# Patient Record
Sex: Female | Born: 1950 | Race: Black or African American | Hispanic: No | Marital: Married
Health system: Southern US, Community
[De-identification: ages and names within clinical notes are randomized; demographics above are authoritative.]

## PROBLEM LIST (undated history)

## (undated) DIAGNOSIS — K648 Other hemorrhoids: Secondary | ICD-10-CM

## (undated) DIAGNOSIS — E119 Type 2 diabetes mellitus without complications: Secondary | ICD-10-CM

## (undated) DIAGNOSIS — K5790 Diverticulosis of intestine, part unspecified, without perforation or abscess without bleeding: Secondary | ICD-10-CM

## (undated) DIAGNOSIS — N6019 Diffuse cystic mastopathy of unspecified breast: Secondary | ICD-10-CM

## (undated) DIAGNOSIS — N3289 Other specified disorders of bladder: Secondary | ICD-10-CM

## (undated) HISTORY — PX: HERNIA REPAIR: SHX51

---

## 2004-10-23 ENCOUNTER — Ambulatory Visit: Payer: Self-pay | Admitting: Obstetrics and Gynecology

## 2004-11-23 ENCOUNTER — Ambulatory Visit: Payer: Self-pay | Admitting: Unknown Physician Specialty

## 2004-11-23 HISTORY — PX: COLONOSCOPY: SHX174

## 2005-10-11 HISTORY — PX: BREAST BIOPSY: SHX20

## 2005-11-09 ENCOUNTER — Ambulatory Visit: Payer: Self-pay | Admitting: Obstetrics and Gynecology

## 2005-11-16 ENCOUNTER — Ambulatory Visit: Payer: Self-pay | Admitting: Obstetrics and Gynecology

## 2006-05-19 ENCOUNTER — Ambulatory Visit: Payer: Self-pay | Admitting: Surgery

## 2006-06-21 ENCOUNTER — Other Ambulatory Visit: Payer: Self-pay

## 2006-07-01 ENCOUNTER — Ambulatory Visit: Payer: Self-pay | Admitting: Surgery

## 2006-12-12 ENCOUNTER — Ambulatory Visit: Payer: Self-pay | Admitting: Obstetrics and Gynecology

## 2007-12-19 ENCOUNTER — Ambulatory Visit: Payer: Self-pay | Admitting: Obstetrics and Gynecology

## 2008-12-23 ENCOUNTER — Ambulatory Visit: Payer: Self-pay | Admitting: Obstetrics and Gynecology

## 2009-12-24 ENCOUNTER — Ambulatory Visit: Payer: Self-pay | Admitting: Obstetrics and Gynecology

## 2010-12-14 ENCOUNTER — Ambulatory Visit: Payer: Self-pay | Admitting: Unknown Physician Specialty

## 2010-12-28 ENCOUNTER — Ambulatory Visit: Payer: Self-pay | Admitting: Obstetrics and Gynecology

## 2011-12-29 ENCOUNTER — Ambulatory Visit: Payer: Self-pay | Admitting: Obstetrics and Gynecology

## 2012-12-29 ENCOUNTER — Ambulatory Visit: Payer: Self-pay | Admitting: Obstetrics and Gynecology

## 2013-12-31 ENCOUNTER — Ambulatory Visit: Payer: Self-pay | Admitting: Obstetrics and Gynecology

## 2014-01-02 ENCOUNTER — Ambulatory Visit: Payer: Self-pay | Admitting: Obstetrics and Gynecology

## 2014-07-02 ENCOUNTER — Encounter: Payer: Self-pay | Admitting: Urology

## 2014-07-05 ENCOUNTER — Ambulatory Visit: Payer: Self-pay | Admitting: Obstetrics and Gynecology

## 2014-07-11 ENCOUNTER — Encounter: Payer: Self-pay | Admitting: Urology

## 2014-08-11 ENCOUNTER — Encounter: Payer: Self-pay | Admitting: Urology

## 2014-09-10 ENCOUNTER — Encounter: Payer: Self-pay | Admitting: Urology

## 2015-01-06 ENCOUNTER — Ambulatory Visit: Payer: Self-pay | Admitting: Obstetrics and Gynecology

## 2015-08-01 ENCOUNTER — Encounter: Payer: Self-pay | Admitting: Physician Assistant

## 2015-08-01 ENCOUNTER — Ambulatory Visit: Payer: Self-pay | Admitting: Physician Assistant

## 2015-08-01 VITALS — BP 144/82 | Temp 98.1°F

## 2015-08-01 DIAGNOSIS — R059 Cough, unspecified: Secondary | ICD-10-CM

## 2015-08-01 DIAGNOSIS — R05 Cough: Secondary | ICD-10-CM

## 2015-08-01 NOTE — Progress Notes (Signed)
S:  Still coughing, sugars have been higher than normal so didn't want to use the prednisone, also had flu shot last Thursday, but cough has not gotten worse since injection, states is better when she elevates her head, using inhaler with a little relief, no cp/sob/; mucus is clear, no fever/chills  O: vitals wnl, nad, tms clear, nasal mucosa pale pink and swollen, throat wnl, neck supple no lymph, lungs c t a,c v rrr  A: cough  P: trial of flonase, if cough not improved by Wed 10/26 will order cxr; also discussed silent reflux being part of problem, pt wants to only use flonase and albuterol for right now, if cxr normal and cough is continuing will do trial of zantac or omeprazole

## 2015-08-11 ENCOUNTER — Ambulatory Visit
Admission: RE | Admit: 2015-08-11 | Discharge: 2015-08-11 | Disposition: A | Discharge status: Home / Self Care | Payer: PRIVATE HEALTH INSURANCE | Source: Ambulatory Visit | Attending: Physician Assistant | Admitting: Physician Assistant

## 2015-08-11 DIAGNOSIS — R05 Cough: Secondary | ICD-10-CM | POA: Diagnosis not present

## 2015-08-11 NOTE — Progress Notes (Signed)
I spoke with the patient about her xray results and she expressed understanding.  Per Susan's authorization I called in Omeprazole 20mg  with 3 refills into [facility].

## 2015-08-11 NOTE — Addendum Note (Signed)
Addended by: Versie Starks on: 08/11/2015 09:45 AM   Modules accepted: Orders

## 2015-08-11 NOTE — Progress Notes (Signed)
Pt called and stated she is still coughing, cxr ordered

## 2015-10-29 ENCOUNTER — Other Ambulatory Visit: Payer: Self-pay | Admitting: Obstetrics and Gynecology

## 2015-10-29 DIAGNOSIS — Z1231 Encounter for screening mammogram for malignant neoplasm of breast: Secondary | ICD-10-CM

## 2015-11-17 ENCOUNTER — Other Ambulatory Visit: Payer: Self-pay

## 2015-11-17 DIAGNOSIS — E119 Type 2 diabetes mellitus without complications: Secondary | ICD-10-CM

## 2015-11-17 NOTE — Progress Notes (Signed)
Patient came in to have blood drawn per Dr. Blane Ohara order.  Blood was drawn from the right arm without any incident. Patient wants results sent to Dr. Ola Spurr and a copy mailed to her attention to her home address.

## 2015-11-18 LAB — CMP12+LP+TP+TSH+6AC+CBC/D/PLT
ALBUMIN: 4 g/dL (ref 3.6–4.8)
ALT: 28 IU/L (ref 0–32)
AST: 20 IU/L (ref 0–40)
Albumin/Globulin Ratio: 1.4 (ref 1.1–2.5)
Alkaline Phosphatase: 96 IU/L (ref 39–117)
BASOS ABS: 0 10*3/uL (ref 0.0–0.2)
BILIRUBIN TOTAL: 0.4 mg/dL (ref 0.0–1.2)
BUN/Creatinine Ratio: 14 (ref 11–26)
BUN: 13 mg/dL (ref 8–27)
Basos: 0 %
CHLORIDE: 102 mmol/L (ref 96–106)
CHOLESTEROL TOTAL: 129 mg/dL (ref 100–199)
CREATININE: 0.9 mg/dL (ref 0.57–1.00)
Calcium: 9 mg/dL (ref 8.7–10.3)
Chol/HDL Ratio: 3.1 ratio units (ref 0.0–4.4)
EOS (ABSOLUTE): 0.2 10*3/uL (ref 0.0–0.4)
Eos: 3 %
FREE THYROXINE INDEX: 2.4 (ref 1.2–4.9)
GFR, EST AFRICAN AMERICAN: 78 mL/min/{1.73_m2} (ref >59)
GFR, EST NON AFRICAN AMERICAN: 68 mL/min/{1.73_m2} (ref >59)
GGT: 22 IU/L (ref 0–60)
Globulin, Total: 2.9 g/dL (ref 1.5–4.5)
Glucose: 132 mg/dL — ABNORMAL HIGH (ref 65–99)
HDL: 41 mg/dL (ref >39)
HEMOGLOBIN: 13.7 g/dL (ref 11.1–15.9)
Hematocrit: 39.7 % (ref 34.0–46.6)
IMMATURE GRANULOCYTES: 0 %
IRON: 107 ug/dL (ref 27–139)
Immature Grans (Abs): 0 10*3/uL (ref 0.0–0.1)
LDH: 144 IU/L (ref 119–226)
LDL Calculated: 71 mg/dL (ref 0–99)
LYMPHS ABS: 2.5 10*3/uL (ref 0.7–3.1)
Lymphs: 38 %
MCH: 31.3 pg (ref 26.6–33.0)
MCHC: 34.5 g/dL (ref 31.5–35.7)
MCV: 91 fL (ref 79–97)
MONOCYTES: 8 %
Monocytes Absolute: 0.5 10*3/uL (ref 0.1–0.9)
NEUTROS PCT: 51 %
Neutrophils Absolute: 3.3 10*3/uL (ref 1.4–7.0)
PLATELETS: 285 10*3/uL (ref 150–379)
Phosphorus: 3.7 mg/dL (ref 2.5–4.5)
Potassium: 4.4 mmol/L (ref 3.5–5.2)
RBC: 4.38 x10E6/uL (ref 3.77–5.28)
RDW: 14.5 % (ref 12.3–15.4)
Sodium: 143 mmol/L (ref 134–144)
T3 UPTAKE RATIO: 24 % (ref 24–39)
T4 TOTAL: 9.9 ug/dL (ref 4.5–12.0)
TOTAL PROTEIN: 6.9 g/dL (ref 6.0–8.5)
TSH: 3.44 u[IU]/mL (ref 0.450–4.500)
Triglycerides: 83 mg/dL (ref 0–149)
Uric Acid: 5.8 mg/dL (ref 2.5–7.1)
VLDL CHOLESTEROL CAL: 17 mg/dL (ref 5–40)
WBC: 6.6 10*3/uL (ref 3.4–10.8)

## 2015-11-18 LAB — HGB A1C W/O EAG: HEMOGLOBIN A1C: 7.5 % — AB (ref 4.8–5.6)

## 2015-11-19 NOTE — Progress Notes (Signed)
A copy of the lab results were mailed to patient and a copy was sent to patient's primary care provider.

## 2016-01-07 ENCOUNTER — Ambulatory Visit
Admission: RE | Admit: 2016-01-07 | Discharge: 2016-01-07 | Disposition: A | Discharge status: Home / Self Care | Payer: Managed Care, Other (non HMO) | Source: Ambulatory Visit | Attending: Obstetrics and Gynecology | Admitting: Obstetrics and Gynecology

## 2016-01-07 DIAGNOSIS — Z1231 Encounter for screening mammogram for malignant neoplasm of breast: Secondary | ICD-10-CM | POA: Diagnosis present

## 2016-01-28 ENCOUNTER — Encounter: Payer: Self-pay | Admitting: Physician Assistant

## 2016-01-28 ENCOUNTER — Ambulatory Visit: Payer: Self-pay | Admitting: Physician Assistant

## 2016-01-28 VITALS — BP 125/80 | HR 91 | Temp 99.4°F

## 2016-01-28 DIAGNOSIS — J069 Acute upper respiratory infection, unspecified: Secondary | ICD-10-CM

## 2016-01-28 DIAGNOSIS — R509 Fever, unspecified: Secondary | ICD-10-CM

## 2016-01-28 LAB — POCT INFLUENZA A/B
INFLUENZA A, POC: NEGATIVE
INFLUENZA B, POC: NEGATIVE

## 2016-01-28 MED ORDER — ALBUTEROL SULFATE HFA 108 (90 BASE) MCG/ACT IN AERS: 2.0000 | INHALATION_SPRAY | RESPIRATORY_TRACT | Status: DC | PRN

## 2016-01-28 MED ORDER — LEVOFLOXACIN 500 MG PO TABS: 500.0000 mg | ORAL_TABLET | Freq: Every day | ORAL | Status: DC

## 2016-01-28 NOTE — Progress Notes (Signed)
S: c/o continued cough and congestion with rattling in her chest at night, having to use inhaler on and off, mucus is mostly clear, denies blood in mucus; had a negative cxr on 08/10/2016; states cough has never completely gone away since then, saw her pcp and he reviewed the cxr results; yesterday started with fever chills body aches; still has cough, is exposed to second hand cigarrette smoke, her husband recently stopped smoking in the house due to her having trouble with cough, is a nonsmoker; parents did not smoke in the house when she was young  O: vitals wnl, nad, tms clear, nasal mucosa swollen, throat wnl, neck supple no lymph, lungs mostly c but occasional wheeze noted in upper lung fields, cv rrr, flu swab neg  A: acute upper respiratory, ?reactive airway disease  P: levaquin due to fever with cough and neg flu, refill on albuterol, return to clinic or pcp if getting worse or not improving

## 2016-01-31 ENCOUNTER — Ambulatory Visit
Admission: EM | Admit: 2016-01-31 | Discharge: 2016-01-31 | Disposition: A | Discharge status: Home / Self Care | Payer: Managed Care, Other (non HMO) | Attending: Family Medicine | Admitting: Family Medicine

## 2016-01-31 ENCOUNTER — Encounter: Payer: Self-pay | Admitting: *Deleted

## 2016-01-31 ENCOUNTER — Ambulatory Visit (INDEPENDENT_AMBULATORY_CARE_PROVIDER_SITE_OTHER): Payer: Managed Care, Other (non HMO)

## 2016-01-31 DIAGNOSIS — J302 Other seasonal allergic rhinitis: Secondary | ICD-10-CM

## 2016-01-31 DIAGNOSIS — J189 Pneumonia, unspecified organism: Secondary | ICD-10-CM

## 2016-01-31 DIAGNOSIS — H6593 Unspecified nonsuppurative otitis media, bilateral: Secondary | ICD-10-CM

## 2016-01-31 HISTORY — DX: Type 2 diabetes mellitus without complications: E11.9

## 2016-01-31 MED ORDER — IPRATROPIUM-ALBUTEROL 0.5-2.5 (3) MG/3ML IN SOLN
3.0000 mL | Freq: Once | RESPIRATORY_TRACT | Status: AC
  Administered 2016-01-31: 3 mL via RESPIRATORY_TRACT

## 2016-01-31 MED ORDER — SALINE SPRAY 0.65 % NA SOLN: 2.0000 | NASAL | Status: DC

## 2016-01-31 MED ORDER — CEFDINIR 300 MG PO CAPS: 300.0000 mg | ORAL_CAPSULE | Freq: Two times a day (BID) | ORAL | Status: AC

## 2016-01-31 NOTE — Discharge Instructions (Signed)
Allergic Rhinitis Allergic rhinitis is when the mucous membranes in the nose respond to allergens. Allergens are particles in the air that cause your body to have an allergic reaction. This causes you to release allergic antibodies. Through a chain of events, these eventually cause you to release histamine into the blood stream. Although meant to protect the body, it is this release of histamine that causes your discomfort, such as frequent sneezing, congestion, and an itchy, runny nose.  CAUSES Seasonal allergic rhinitis (hay fever) is caused by pollen allergens that may come from grasses, trees, and weeds. Year-round allergic rhinitis (perennial allergic rhinitis) is caused by allergens such as house dust mites, pet dander, and mold spores. SYMPTOMS  Nasal stuffiness (congestion).  Itchy, runny nose with sneezing and tearing of the eyes. DIAGNOSIS Your health care provider can help you determine the allergen or allergens that trigger your symptoms. If you and your health care provider are unable to determine the allergen, skin or blood testing may be used. Your health care provider will diagnose your condition after taking your health history and performing a physical exam. Your health care provider may assess you for other related conditions, such as asthma, pink eye, or an ear infection. TREATMENT Allergic rhinitis does not have a cure, but it can be controlled by:  Medicines that block allergy symptoms. These may include allergy shots, nasal sprays, and oral antihistamines.  Avoiding the allergen. Hay fever may often be treated with antihistamines in pill or nasal spray forms. Antihistamines block the effects of histamine. There are over-the-counter medicines that may help with nasal congestion and swelling around the eyes. Check with your health care provider before taking or giving this medicine. If avoiding the allergen or the medicine prescribed do not work, there are many new medicines  your health care provider can prescribe. Stronger medicine may be used if initial measures are ineffective. Desensitizing injections can be used if medicine and avoidance does not work. Desensitization is when a patient is given ongoing shots until the body becomes less sensitive to the allergen. Make sure you follow up with your health care provider if problems continue. HOME CARE INSTRUCTIONS It is not possible to completely avoid allergens, but you can reduce your symptoms by taking steps to limit your exposure to them. It helps to know exactly what you are allergic to so that you can avoid your specific triggers. SEEK MEDICAL CARE IF:  You have a fever.  You develop a cough that does not stop easily (persistent).  You have shortness of breath.  You start wheezing.  Symptoms interfere with normal daily activities.   This information is not intended to replace advice given to you by your health care provider. Make sure you discuss any questions you have with your health care provider.   Document Released: 06/22/2001 Document Revised: 10/18/2014 Document Reviewed: 06/04/2013 Elsevier Interactive Patient Education 2016 [place]. Otitis Media With Effusion Otitis media with effusion is the presence of fluid in the middle ear. This is a common problem in children, which often follows ear infections. It may be present for weeks or longer after the infection. Unlike an acute ear infection, otitis media with effusion refers only to fluid behind the ear drum and not infection. Children with repeated ear and sinus infections and allergy problems are the most likely to get otitis media with effusion. CAUSES  The most frequent cause of the fluid buildup is dysfunction of the eustachian tubes. These are the tubes that drain fluid  in the ears to the back of the nose (nasopharynx). SYMPTOMS   The main symptom of this condition is hearing loss. As a result, you or your child may:  Listen to the TV  at a loud volume.  Not respond to questions.  Ask "what" often when spoken to.  Mistake or confuse one sound or word for another.  There may be a sensation of fullness or pressure but usually not pain. DIAGNOSIS   Your health care provider will diagnose this condition by examining you or your child's ears.  Your health care provider may test the pressure in you or your child's ear with a tympanometer.  A hearing test may be conducted if the problem persists. TREATMENT   Treatment depends on the duration and the effects of the effusion.  Antibiotics, decongestants, nose drops, and cortisone-type drugs (tablets or nasal spray) may not be helpful.  Children with persistent ear effusions may have delayed language or behavioral problems. Children at risk for developmental delays in hearing, learning, and speech may require referral to a specialist earlier than children not at risk.  You or your child's health care provider may suggest a referral to an ear, nose, and throat surgeon for treatment. The following may help restore normal hearing:  Drainage of fluid.  Placement of ear tubes (tympanostomy tubes).  Removal of adenoids (adenoidectomy). HOME CARE INSTRUCTIONS   Avoid secondhand smoke.  Infants who are breastfed are less likely to have this condition.  Avoid feeding infants while they are lying flat.  Avoid known environmental allergens.  Avoid people who are sick. SEEK MEDICAL CARE IF:   Hearing is not better in 3 months.  Hearing is worse.  Ear pain.  Drainage from the ear.  Dizziness. MAKE SURE YOU:   Understand these instructions.  Will watch your condition.  Will get help right away if you are not doing well or get worse.   This information is not intended to replace advice given to you by your health care provider. Make sure you discuss any questions you have with your health care provider.   Document Released: 11/04/2004 Document Revised:  10/18/2014 Document Reviewed: 04/24/2013 Elsevier Interactive Patient Education 2016 [place] Pneumonia, Adult Pneumonia is an infection of the lungs. There are different types of pneumonia. One type can develop while a person is in a hospital. A different type, called community-acquired pneumonia, develops in people who are not, or have not recently been, in the hospital or other health care facility.  CAUSES Pneumonia may be caused by bacteria, viruses, or funguses. Community-acquired pneumonia is often caused by Streptococcus pneumonia bacteria. These bacteria are often passed from one person to another by breathing in droplets from the cough or sneeze of an infected person. RISK FACTORS The condition is more likely to develop in:  People who havechronic diseases, such as chronic obstructive pulmonary disease (COPD), asthma, congestive heart failure, cystic fibrosis, diabetes, or kidney disease.  People who haveearly-stage or late-stage HIV.  People who havesickle cell disease.  People who havehad their spleen removed (splenectomy).  People who havepoor Human resources officer.  People who havemedical conditions that increase the risk of breathing in (aspirating) secretions their own mouth and nose.   People who havea weakened immune system (immunocompromised).  People who smoke.  People whotravel to areas where pneumonia-causing germs commonly exist.  People whoare around animal habitats or animals that have pneumonia-causing germs, including birds, bats, rabbits, cats, and farm animals. SYMPTOMS Symptoms of this condition include:  Adry cough.  A wet (productive) cough.  Fever.  Sweating.  Chest pain, especially when breathing deeply or coughing.  Rapid breathing or difficulty breathing.  Shortness of breath.  Shaking chills.  Fatigue.  Muscle aches. DIAGNOSIS Your health care provider will take a medical history and perform a  physical exam. You may also have other tests, including:  Imaging studies of your chest, including X-rays.  Tests to check your blood oxygen level and other blood gases.  Other tests on blood, mucus (sputum), fluid around your lungs (pleural fluid), and urine. If your pneumonia is severe, other tests may be done to identify the specific cause of your illness. TREATMENT The type of treatment that you receive depends on many factors, such as the cause of your pneumonia, the medicines you take, and other medical conditions that you have. For most adults, treatment and recovery from pneumonia may occur at home. In some cases, treatment must happen in a hospital. Treatment may include:  Antibiotic medicines, if the pneumonia was caused by bacteria.  Antiviral medicines, if the pneumonia was caused by a virus.  Medicines that are given by mouth or through an IV tube.  Oxygen.  Respiratory therapy. Although rare, treating severe pneumonia may include:  Mechanical ventilation. This is done if you are not breathing well on your own and you cannot maintain a safe blood oxygen level.  Thoracentesis. This procedureremoves fluid around one lung or both lungs to help you breathe better. HOME CARE INSTRUCTIONS  Take over-the-counter and prescription medicines only as told by your health care provider.  Only takecough medicine if you are losing sleep. Understand that cough medicine can prevent your body's natural ability to remove mucus from your lungs.  If you were prescribed an antibiotic medicine, take it as told by your health care provider. Do not stop taking the antibiotic even if you start to feel better.  Sleep in a semi-upright position at night. Try sleeping in a reclining chair, or place a few pillows under your head.  Do not use tobacco products, including cigarettes, chewing tobacco, and e-cigarettes. If you need help quitting, ask your health care provider.  Drink enough water to  keep your urine clear or pale yellow. This will help to thin out mucus secretions in your lungs. PREVENTION There are ways that you can decrease your risk of developing community-acquired pneumonia. Consider getting a pneumococcal vaccine if:  You are older than 65 years of age.  You are older than 65 years of age and are undergoing cancer treatment, have chronic lung disease, or have other medical conditions that affect your immune system. Ask your health care provider if this applies to you. There are different types and schedules of pneumococcal vaccines. Ask your health care provider which vaccination option is best for you. You may also prevent community-acquired pneumonia if you take these actions:  Get an influenza vaccine every year. Ask your health care provider which type of influenza vaccine is best for you.  Go to the dentist on a regular basis.  Wash your hands often. Use hand sanitizer if soap and water are not available. SEEK MEDICAL CARE IF:  You have a fever.  You are losing sleep because you cannot control your cough with cough medicine. SEEK IMMEDIATE MEDICAL CARE IF:  You have worsening shortness of breath.  You have increased chest pain.  Your sickness becomes worse, especially if you are an older adult or have a weakened immune system.  You cough up  blood.   This information is not intended to replace advice given to you by your health care provider. Make sure you discuss any questions you have with your health care provider.   Document Released: 09/27/2005 Document Revised: 06/18/2015 Document Reviewed: 01/22/2015 Elsevier Interactive Patient Education [employer].

## 2016-01-31 NOTE — ED Notes (Signed)
Onset of URI type symptoms last Monday, seen at her PCP on Weds and tested neg for flu, pt not sure of dx at that time but she was rx Levofloxacin. Here today for persisting and worsening symptoms. Pt is NIDDM and is unable to eat, states no appetite. Pt feels that her antibiotic is causing some of her symptoms.

## 2016-01-31 NOTE — ED Provider Notes (Signed)
CSN: [id number]     Arrival date & time 01/31/16  1306 History   First MD Initiated Contact with Patient 01/31/16 1403     Chief Complaint  Patient presents with  . Cough  . Nausea  . Chills  . Fever   (Consider location/radiation/quality/duration/timing/severity/associated sxs/prior Treatment) HPI Comments: Single african [place] female here for evaluation wheezing, stomach upset on levaquin given to her by PA Manuela Schwartz Fischer 19 Apr.  Finger stick elevated last night 201 and this am 168 despite decreased po intake, nausea.  Has had reoccurent wheezing with seasonal allergies the past couple of years and was given albuterol inhaler to use prn.  Last used yesterday before bed 1 puff helps some.  Hasn't taken claritin since starting levaquin Wednesday.  Flu test negative on 19th has had exposure to others sick.  Clear nasal discharge and cough productive rarely yellow    Patient is a 66 y.o. female presenting with cough and fever. The history is provided by the patient.  Cough Cough characteristics:  Productive Sputum characteristics:  Clear and yellow Severity:  Moderate Onset quality:  Gradual Duration:  6 days Timing:  Intermittent Progression:  Waxing and waning Chronicity:  New Smoker: no   Context: exposure to allergens, sick contacts, upper respiratory infection, weather changes and with activity   Context: not animal exposure, not fumes, not occupational exposure and not smoke exposure   Relieved by:  Nothing Worsened by:  Deep breathing, exposure to cold air, lying down, environmental changes and activity Ineffective treatments:  Rest and ipratropium inhaler Associated symptoms: chills, ear fullness, fever, headaches, myalgias, rhinorrhea, shortness of breath, sinus congestion, sore throat and wheezing   Associated symptoms: no chest pain, no diaphoresis, no ear pain, no eye discharge, no rash and no weight loss   Fever:    Duration:  6 days   Timing:  Intermittent   Max temp  PTA (F):  100 tmax   Temp source:  Oral   Progression:  Waxing and waning Headaches:    Severity:  Mild   Onset quality:  Sudden   Duration:  6 days   Timing:  Intermittent   Progression:  Waxing and waning Myalgias:    Location:  Generalized   Quality:  Aching   Severity:  Moderate   Onset quality:  Sudden   Duration:  6 days   Progression:  Waxing and waning Rhinorrhea:    Quality:  White   Severity:  Moderate   Duration:  6 days   Timing:  Constant   Progression:  Waxing and waning Shortness of breath:    Severity:  Mild   Onset quality:  Sudden   Duration:  6 days   Timing:  Intermittent   Progression:  Waxing and waning Sore throat:    Severity:  Mild   Onset quality:  Sudden   Duration:  6 days   Timing:  Constant   Progression:  Waxing and waning Wheezing:    Severity:  Moderate   Onset quality:  Sudden   Duration:  6 days   Timing:  Intermittent   Progression:  Waxing and waning   Chronicity:  Recurrent Risk factors: recent infection   Risk factors: no chemical exposure and no recent travel   Fever Associated symptoms: chills, congestion, cough, headaches, myalgias, nausea, rhinorrhea and sore throat   Associated symptoms: no chest pain, no confusion, no diarrhea, no dysuria, no ear pain, no rash and no vomiting     Past Medical History  Diagnosis Date  . Diabetes mellitus without complication [facility])    Past Surgical History  Procedure Laterality Date  . Breast biopsy Right 2007    EXCISIONAL - NEG   Family History  Problem Relation Age of Onset  . Breast cancer Neg Hx    Social History  Substance Use Topics  . Smoking status: Passive Smoke Exposure - Never Smoker  . Smokeless tobacco: None  . Alcohol Use: No   OB History    No data available     Review of Systems  Constitutional: Positive for fever, chills, activity change, appetite change and fatigue. Negative for weight loss and diaphoresis.  HENT: Positive for congestion, postnasal  drip, rhinorrhea, sinus pressure, sneezing and sore throat. Negative for dental problem, drooling, ear discharge, ear pain, facial swelling, hearing loss, mouth sores, nosebleeds, tinnitus, trouble swallowing and voice change.   Eyes: Negative for photophobia, pain, discharge, redness, itching and visual disturbance.  Respiratory: Positive for cough, shortness of breath and wheezing. Negative for choking, chest tightness and stridor.   Cardiovascular: Negative for chest pain.  Gastrointestinal: Positive for nausea. Negative for vomiting, abdominal pain, diarrhea, constipation, blood in stool and abdominal distention.  Endocrine: Negative for cold intolerance and heat intolerance.  Genitourinary: Negative for dysuria.  Musculoskeletal: Positive for myalgias. Negative for back pain, joint swelling, arthralgias, gait problem, neck pain and neck stiffness.  Skin: Negative for color change, pallor, rash and wound.  Allergic/Immunologic: Positive for environmental allergies. Negative for food allergies and immunocompromised state.  Neurological: Positive for headaches. Negative for dizziness, tremors, seizures, syncope, facial asymmetry, speech difficulty, weakness, light-headedness and numbness.  Hematological: Negative for adenopathy. Does not bruise/bleed easily.  Psychiatric/Behavioral: Negative for behavioral problems, confusion, sleep disturbance and agitation.    Allergies  Codeine and Sulfa antibiotics  Home Medications   Prior to Admission medications   Medication Sig Start Date End Date Taking? Authorizing Provider  albuterol (PROVENTIL HFA) 108 (90 Base) MCG/ACT inhaler Inhale 2 puffs into the lungs every 4 (four) hours as needed for wheezing or shortness of breath. 01/28/16  Yes Versie Starks, PA-C  aspirin 81 MG tablet Take 81 mg by mouth daily.   Yes Historical Provider, MD  Blood Glucose Monitoring Suppl (FIFTY50 GLUCOSE METER 2.0) W/DEVICE KIT Use 1 each as directed. 11/06/14  Yes  Historical Provider, MD  calcium carbonate (OSCAL) 1500 (600 CA) MG TABS tablet Take by mouth 2 (two) times daily with a meal.   Yes Historical Provider, MD  levofloxacin (LEVAQUIN) 500 MG tablet Take 1 tablet (500 mg total) by mouth daily. 01/28/16  Yes Versie Starks, PA-C  loratadine (CLARITIN) 10 MG tablet Take 10 mg by mouth daily.   Yes Historical Provider, MD  metFORMIN (GLUCOPHAGE) 500 MG tablet Take 1,000 mg by mouth 2 (two) times daily with a meal.    Yes Historical Provider, MD  Multiple Vitamin (MULTIVITAMIN) tablet Take 1 tablet by mouth daily.   Yes Historical Provider, MD  cefdinir (OMNICEF) 300 MG capsule Take 1 capsule (300 mg total) by mouth 2 (two) times daily. 01/31/16 02/10/16  Olen Cordial, NP  sodium chloride (OCEAN) 0.65 % SOLN nasal spray Place 2 sprays into both nostrils every 2 (two) hours while awake. 01/31/16   Olen Cordial, NP   Meds Ordered and Administered this Visit   Medications  ipratropium-albuterol (DUONEB) 0.5-2.5 (3) MG/3ML nebulizer solution 3 mL (3 mLs Nebulization Given 01/31/16 1416)    BP 143/67 mmHg  Temp(Src) 100 F (37.8  C) (Oral)  Resp 16  Ht _0  (1.626 m)  Wt 210 lb (95.255 kg)  BMI 36.03 kg/m2  SpO2 95% No data found.   Physical Exam  Constitutional: She is oriented to person, place, and time. She appears well-developed and well-nourished. She is active and cooperative.  Non-toxic appearance. She does not have a sickly appearance. She appears ill. No distress.  HENT:  Head: Normocephalic and atraumatic.  Right Ear: Hearing, external ear and ear canal normal. A middle ear effusion is present.  Left Ear: Hearing, external ear and ear canal normal. A middle ear effusion is present.  Nose: Mucosal edema and rhinorrhea present. No nose lacerations, sinus tenderness, nasal deformity, septal deviation or nasal septal hematoma. No epistaxis.  No foreign bodies. Right sinus exhibits no maxillary sinus tenderness and no frontal sinus  tenderness. Left sinus exhibits no maxillary sinus tenderness and no frontal sinus tenderness.  Mouth/Throat: Uvula is midline and mucous membranes are normal. Mucous membranes are not pale, not dry and not cyanotic. She does not have dentures. No oral lesions. No trismus in the jaw. Normal dentition. No dental abscesses, uvula swelling, lacerations or dental caries. Posterior oropharyngeal edema and posterior oropharyngeal erythema present. No oropharyngeal exudate or tonsillar abscesses.  Cobblestoning posterior pharynx; bilateral TMs with air fluid level; nasal turbinates with edema/erythema clear discharge  Eyes: Conjunctivae, EOM and lids are normal. Pupils are equal, round, and reactive to light. Right eye exhibits no chemosis, no discharge, no exudate and no hordeolum. No foreign body present in the right eye. Left eye exhibits no chemosis, no discharge, no exudate and no hordeolum. No foreign body present in the left eye. Right conjunctiva is not injected. Right conjunctiva has no hemorrhage. Left conjunctiva is not injected. Left conjunctiva has no hemorrhage. No scleral icterus. Right eye exhibits normal extraocular motion and no nystagmus. Left eye exhibits normal extraocular motion and no nystagmus. Right pupil is round and reactive. Left pupil is round and reactive. Pupils are equal.  Neck: Trachea normal and normal range of motion. Neck supple. No tracheal tenderness, no spinous process tenderness and no muscular tenderness present. No rigidity. No tracheal deviation, no edema, no erythema and normal range of motion present. No thyroid mass and no thyromegaly present.  Cardiovascular: Normal rate, regular rhythm, S1 normal, S2 normal, normal heart sounds and intact distal pulses.  PMI is not displaced.  Exam reveals no gallop and no friction rub.   No murmur heard. Pulmonary/Chest: Effort normal. No accessory muscle usage or stridor. No respiratory distress. She has decreased breath sounds in  the right lower field and the left lower field. She has wheezes in the right upper field, the right middle field, the right lower field, the left upper field, the left middle field and the left lower field. She has no rhonchi. She has no rales. She exhibits no tenderness.  Audible wheezing without auscultation; inspiratory and expiratory; decreased basilar breath sounds  Abdominal: Soft. She exhibits no shifting dullness, no distension, no pulsatile liver, no fluid wave, no abdominal bruit, no ascites, no pulsatile midline mass and no mass. Bowel sounds are decreased. There is no hepatosplenomegaly. There is generalized tenderness. There is no rigidity, no rebound, no guarding, no CVA tenderness, no tenderness at McBurney's point and negative Murphy's sign. Hernia confirmed negative in the ventral area.  Dull to percussion x 4 quads; hypoactive bowel sounds x 4 quads  Musculoskeletal: Normal range of motion. She exhibits no edema or tenderness.  Right shoulder: Normal.       Left shoulder: Normal.       Right hip: Normal.       Left hip: Normal.       Right knee: Normal.       Left knee: Normal.       Cervical back: Normal.       Right hand: Normal.       Left hand: Normal.  Lymphadenopathy:       Head (right side): No submental, no submandibular, no tonsillar, no preauricular, no posterior auricular and no occipital adenopathy present.       Head (left side): No submental, no submandibular, no tonsillar, no preauricular, no posterior auricular and no occipital adenopathy present.    She has no cervical adenopathy.       Right cervical: No superficial cervical, no deep cervical and no posterior cervical adenopathy present.      Left cervical: No superficial cervical, no deep cervical and no posterior cervical adenopathy present.  Neurological: She is alert and oriented to person, place, and time. She has normal strength. She is not disoriented. She displays no atrophy and no tremor. No  cranial nerve deficit or sensory deficit. She exhibits normal muscle tone. She displays no seizure activity. Coordination and gait normal. GCS eye subscore is 4. GCS verbal subscore is 5. GCS motor subscore is 6.  Skin: Skin is warm, dry and intact. No abrasion, no bruising, no burn, no ecchymosis, no laceration, no lesion, no petechiae and no rash noted. She is not diaphoretic. No cyanosis or erythema. No pallor. Nails show no clubbing.  Psychiatric: She has a normal mood and affect. Her speech is normal and behavior is normal. Judgment and thought content normal. Cognition and memory are normal.  Nursing note and vitals reviewed.   ED Course  Procedures (including critical care time)  Labs Review Labs Reviewed - No data to display  Imaging Review Dg Chest 2 View  01/31/2016  CLINICAL DATA:  Productive cough for 5 days now along with wheezing. Non-smoker. No history of heart issues. No history of asthma. Patient reports no other symptoms at this time. 1st time being seen for. EXAM: CHEST  2 VIEW COMPARISON:  08/11/2015 FINDINGS: Heart size is normal. Faint opacity is identified within the left lower lobe, indicating probable early infectious process. There are no pleural effusions or pulmonary edema. IMPRESSION: Suspect early left lower lobe infiltrate. Followup PA and lateral chest X-ray is recommended in 3-4 weeks following trial of antibiotic therapy to ensure resolution and exclude underlying malignancy. Electronically Signed   By: Nolon Nations M.D.   On: 01/31/2016 14:32    1416 duoneb 57m administered by CMA JRedmond Baseman 1535 sp02 95% RA improved air flow expiratory only wheeze now on auscultations s/p duoneb.  Chest xray results infiltrate LLL  Patient asked her antibiotics to be changed as unable to tolerate due to nausea.  Will change to cefdinir 3049mpo BID.  Has robitussin and albuterol inhaler at home for prn cough/wheeze use.  [facility] in [facility]-4 weeks.  Patient given copy of radiology report.  Patient verbalized understanding of information/instructions, agreed with plan of care.  MDM   1. Community acquired pneumonia   2. Seasonal allergic rhinitis   3. Otitis media with effusion, bilateral    Pneumonia community acquired simple, community acquired, may have started as viral (probably respiratory syncytial, parainfluenza, influenza, or adenovirus), but  now evidence of acute periobronchial pneumonia LLL on chest xray.  Patient not tolerating levaquin will change to cefdinir 327m po BID x 10 days.  Continue albuterol 2 puffs but ATC x 48 hours q4-6h instead of just prn due to chronic wheezing.  Differential Diagnoses:  Reactive Airway Disease (asthma, allergic aspergillosis eosinophilia), chronic bronchitis, respiratory infection (sinusitis, common cold, pneumonia), congestive heart failure, smoke/irritant exposure, reflux esophagitis, bronchogenic tumor, and/or aspiration syndromes.  I will give   I discussed that approximately 50% of patients with acute bronchitis have a cough that lasts up to three weeks, and 25% for over a month. Tylenol, one to two tablets every four hours as needed for fever or myalgias. Patient instructed to follow up in one week with PCM or sooner if symptoms worsen. Patient verbalized agreement and understanding of treatment plan.  P2:  hand washing and cover cough  Has not been consistent with claritin 137mpo daily.  Reinforced daily use.  Nasal saline 2 sprays each nostril q2h prn congestion.  Flonase 1 spray each nostril BID. Avoid triggers if possible.  Shower prior to bedtime if exposed to triggers.  If allergic dust/dust mites recommend mattress/pillow covers/encasements; washing linens, vacuuming, sweeping, dusting weekly.  Call or return to clinic as needed if these symptoms worsen or fail to improve as anticipated.   Exitcare handout on allergic rhinitis given to patient.  Patient verbalized  understanding of instructions, agreed with plan of care and had no further questions at this time.  P2:  Avoidance and hand washing.  Supportive treatment.   No evidence of invasive bacterial infection, non toxic and well hydrated.  This is most likely self limiting viral infection.  I do not see where any further testing or imaging is necessary at this time.   I will suggest supportive care, rest, good hygiene and encourage the patient to take adequate fluids.  The patient is to return to clinic or EMERGENCY ROOM if symptoms worsen or change significantly e.g. ear pain, fever, purulent discharge from ears or bleeding.  Exitcare handout on otitis media with effusion given to patient.  Patient verbalized agreement and understanding of treatment plan.    TiOlen CordialNP 01/31/16 1607

## 2016-02-05 ENCOUNTER — Telehealth: Payer: Self-pay | Admitting: Family Medicine

## 2016-02-05 NOTE — Telephone Encounter (Signed)
Telephone message left for patient I was calling to follow up with her to see if she was feeling better after we changed her antibiotics and how her blood sugar levels and breathing have been this week.  If not improving patient to follow up with [facility] [address] for re-evaluation.

## 2016-03-22 ENCOUNTER — Encounter (INDEPENDENT_AMBULATORY_CARE_PROVIDER_SITE_OTHER): Payer: Self-pay

## 2016-03-22 ENCOUNTER — Other Ambulatory Visit: Payer: Self-pay

## 2016-03-22 DIAGNOSIS — Z299 Encounter for prophylactic measures, unspecified: Secondary | ICD-10-CM

## 2016-03-22 NOTE — Progress Notes (Signed)
Patient came in to have blood work done per Dr. Manon Hilding at [facility].  Blood was drawn from the right arm without any incident.

## 2016-03-23 LAB — MICROALBUMIN / CREATININE URINE RATIO
Creatinine, Urine: 170.4 mg/dL
MICROALB/CREAT RATIO: 4.5 mg/g{creat} (ref 0.0–30.0)
Microalbumin, Urine: 7.7 ug/mL

## 2016-03-23 LAB — HGB A1C W/O EAG: Hgb A1c MFr Bld: 6.8 % — ABNORMAL HIGH (ref 4.8–5.6)

## 2016-11-12 ENCOUNTER — Other Ambulatory Visit: Payer: Self-pay | Admitting: Obstetrics and Gynecology

## 2016-11-12 DIAGNOSIS — Z1231 Encounter for screening mammogram for malignant neoplasm of breast: Secondary | ICD-10-CM

## 2017-01-10 ENCOUNTER — Ambulatory Visit
Admission: RE | Admit: 2017-01-10 | Discharge: 2017-01-10 | Disposition: A | Discharge status: Home / Self Care | Payer: Medicare HMO | Source: Ambulatory Visit | Attending: Obstetrics and Gynecology | Admitting: Obstetrics and Gynecology

## 2017-01-10 DIAGNOSIS — Z1231 Encounter for screening mammogram for malignant neoplasm of breast: Secondary | ICD-10-CM | POA: Diagnosis present

## 2017-03-11 ENCOUNTER — Encounter: Payer: Self-pay | Admitting: *Deleted

## 2017-03-14 ENCOUNTER — Encounter
Admission: RE | Disposition: A | Discharge status: Home / Self Care | Payer: Self-pay | Source: Ambulatory Visit | Attending: Unknown Physician Specialty

## 2017-03-14 ENCOUNTER — Ambulatory Visit: Payer: Medicare HMO | Admitting: Anesthesiology

## 2017-03-14 ENCOUNTER — Encounter: Payer: Self-pay | Admitting: *Deleted

## 2017-03-14 ENCOUNTER — Ambulatory Visit
Admission: RE | Admit: 2017-03-14 | Discharge: 2017-03-14 | Disposition: A | Discharge status: Home / Self Care | Payer: Medicare HMO | Source: Ambulatory Visit | Attending: Unknown Physician Specialty | Admitting: Unknown Physician Specialty

## 2017-03-14 DIAGNOSIS — Z885 Allergy status to narcotic agent status: Secondary | ICD-10-CM | POA: Diagnosis not present

## 2017-03-14 DIAGNOSIS — K640 First degree hemorrhoids: Secondary | ICD-10-CM | POA: Insufficient documentation

## 2017-03-14 DIAGNOSIS — Z882 Allergy status to sulfonamides status: Secondary | ICD-10-CM | POA: Insufficient documentation

## 2017-03-14 DIAGNOSIS — N3289 Other specified disorders of bladder: Secondary | ICD-10-CM | POA: Diagnosis not present

## 2017-03-14 DIAGNOSIS — Z1211 Encounter for screening for malignant neoplasm of colon: Secondary | ICD-10-CM | POA: Insufficient documentation

## 2017-03-14 DIAGNOSIS — D120 Benign neoplasm of cecum: Secondary | ICD-10-CM | POA: Insufficient documentation

## 2017-03-14 DIAGNOSIS — E119 Type 2 diabetes mellitus without complications: Secondary | ICD-10-CM | POA: Insufficient documentation

## 2017-03-14 DIAGNOSIS — K5730 Diverticulosis of large intestine without perforation or abscess without bleeding: Secondary | ICD-10-CM | POA: Insufficient documentation

## 2017-03-14 DIAGNOSIS — Z86010 Personal history of colonic polyps: Secondary | ICD-10-CM | POA: Diagnosis not present

## 2017-03-14 DIAGNOSIS — Z7951 Long term (current) use of inhaled steroids: Secondary | ICD-10-CM | POA: Insufficient documentation

## 2017-03-14 DIAGNOSIS — Z7722 Contact with and (suspected) exposure to environmental tobacco smoke (acute) (chronic): Secondary | ICD-10-CM | POA: Diagnosis not present

## 2017-03-14 DIAGNOSIS — Z9889 Other specified postprocedural states: Secondary | ICD-10-CM | POA: Diagnosis not present

## 2017-03-14 DIAGNOSIS — Z7984 Long term (current) use of oral hypoglycemic drugs: Secondary | ICD-10-CM | POA: Diagnosis not present

## 2017-03-14 DIAGNOSIS — Z79899 Other long term (current) drug therapy: Secondary | ICD-10-CM | POA: Diagnosis not present

## 2017-03-14 DIAGNOSIS — Z7982 Long term (current) use of aspirin: Secondary | ICD-10-CM | POA: Insufficient documentation

## 2017-03-14 DIAGNOSIS — K635 Polyp of colon: Secondary | ICD-10-CM | POA: Insufficient documentation

## 2017-03-14 DIAGNOSIS — D123 Benign neoplasm of transverse colon: Secondary | ICD-10-CM | POA: Diagnosis not present

## 2017-03-14 HISTORY — DX: Other hemorrhoids: K64.8

## 2017-03-14 HISTORY — DX: Diffuse cystic mastopathy of unspecified breast: N60.19

## 2017-03-14 HISTORY — DX: Other specified disorders of bladder: N32.89

## 2017-03-14 HISTORY — PX: COLONOSCOPY WITH PROPOFOL: SHX5780

## 2017-03-14 HISTORY — DX: Diverticulosis of intestine, part unspecified, without perforation or abscess without bleeding: K57.90

## 2017-03-14 LAB — GLUCOSE, CAPILLARY: Glucose-Capillary: 118 mg/dL — ABNORMAL HIGH (ref 65–99)

## 2017-03-14 SURGERY — COLONOSCOPY WITH PROPOFOL
Anesthesia: General

## 2017-03-14 MED ORDER — PROPOFOL 500 MG/50ML IV EMUL
INTRAVENOUS | Status: AC
  Filled 2017-03-14: qty 50

## 2017-03-14 MED ORDER — SODIUM CHLORIDE 0.9 % IV SOLN: INTRAVENOUS | Status: DC

## 2017-03-14 MED ORDER — SODIUM CHLORIDE 0.9 % IV SOLN
INTRAVENOUS | Status: DC
  Administered 2017-03-14 (×2): via INTRAVENOUS

## 2017-03-14 NOTE — Transfer of Care (Signed)
Immediate Anesthesia Transfer of Care Note  Patient: Teresa Black  Procedure(s) Performed: Procedure(s): COLONOSCOPY WITH PROPOFOL (N/A)  Patient Location: PACU  Anesthesia Type:General  Level of Consciousness: awake, alert  and oriented  Airway & Oxygen Therapy: Patient Spontanous Breathing and Patient connected to face mask oxygen  Post-op Assessment: Report given to RN and Post -op Vital signs reviewed and stable  Post vital signs: Reviewed and stable  Last Vitals:  Vitals:   03/14/17 1024 03/14/17 1151  BP: (!) 163/88 113/70  Pulse: 86 87  Resp: 18 19  Temp: 36.2 C (!) 35.9 C    Last Pain:  Vitals:   03/14/17 1151  TempSrc: Tympanic         Complications: No apparent anesthesia complications

## 2017-03-14 NOTE — Anesthesia Post-op Follow-up Note (Cosign Needed)
Anesthesia QCDR form completed.        

## 2017-03-14 NOTE — H&P (Signed)
Primary Care Physician:  Leonel Ramsay, MD Primary Gastroenterologist:  Dr. Vira Agar  Pre-Procedure History & Physical: HPI:  Teresa Black is a 66 y.o. female is here for an colonoscopy.  FH colon cancer in sister.   Past Medical History:  Diagnosis Date  . Bladder spasms   . Diabetes mellitus without complication ([place])   . Diverticulosis   . Fibrocystic breast disease   . Hemorrhoids, internal     Past Surgical History:  Procedure Laterality Date  . BREAST BIOPSY Right 2007   EXCISIONAL - NEG  . COLONOSCOPY  11/23/2004  . HERNIA REPAIR      Prior to Admission medications   Medication Sig Start Date End Date Taking? Authorizing Provider  aspirin 81 MG tablet Take 81 mg by mouth daily.   Yes [provider]  calcium carbonate (OSCAL) 1500 (600 CA) MG TABS tablet Take by mouth 2 (two) times daily with a meal.   Yes [provider]  loratadine (CLARITIN) 10 MG tablet Take 10 mg by mouth daily as needed.    Yes [provider]  metFORMIN (GLUCOPHAGE) 500 MG tablet Take 1,000 mg by mouth 2 (two) times daily with a meal.    Yes [provider]  Multiple Vitamin (MULTIVITAMIN) tablet Take 1 tablet by mouth daily.   Yes [provider]  sodium chloride (OCEAN) 0.65 % SOLN nasal spray Place 2 sprays into both nostrils every 2 (two) hours while awake. 01/31/16  Yes Betancourt, Aura Fey, NP  tolterodine (DETROL LA) 4 MG 24 hr capsule Take 4 mg by mouth daily.   Yes [provider]  albuterol (PROVENTIL HFA) 108 (90 Base) MCG/ACT inhaler Inhale 2 puffs into the lungs every 4 (four) hours as needed for wheezing or shortness of breath. Patient not taking: Reported on 03/14/2017 01/28/16   Versie Starks, PA-C  Blood Glucose Monitoring Suppl (FIFTY50 GLUCOSE METER 2.0) W/DEVICE KIT Use 1 each as directed. 11/06/14   [provider]  levofloxacin (LEVAQUIN) 500 MG tablet Take 1 tablet (500 mg total) by mouth daily. Patient not  taking: Reported on 03/14/2017 01/28/16   Versie Starks, PA-C    Allergies as of 12/27/2016 - Review Complete 02/05/2016  Allergen Reaction Noted  . Codeine Nausea And Vomiting 08/01/2015  . Sulfa antibiotics  08/01/2015    Family History  Problem Relation Age of Onset  . Breast cancer Neg Hx     Social History   Social History  . Marital status: Married    Spouse name: N/A  . Number of children: N/A  . Years of education: N/A   Occupational History  . Not on file.   Social History Main Topics  . Smoking status: Passive Smoke Exposure - Never Smoker  . Smokeless tobacco: Never Used  . Alcohol use No  . Drug use: No  . Sexual activity: Not on file   Other Topics Concern  . Not on file   Social History Narrative  . No narrative on file    Review of Systems: See HPI, otherwise negative ROS  Physical Exam: BP (!) 163/88   Pulse 86   Temp 97.2 F (36.2 C) (Tympanic)   Resp 18   Ht '5\' 4"'$  (1.626 m)   Wt 98.4 kg (217 lb)   SpO2 100%   BMI 37.25 kg/m  General:   Alert,  pleasant and cooperative in NAD Head:  Normocephalic and atraumatic. Neck:  Supple; no masses or thyromegaly. Lungs:  Clear throughout to auscultation.    Heart:  Regular rate and rhythm. Abdomen:  Soft, nontender and nondistended. Normal bowel sounds, without guarding, and without rebound.   Neurologic:  Alert and  oriented x4;  grossly normal neurologically.  Impression/Plan: Teresa Black is here for an colonoscopy to be performed for colonoscopy  Risks, benefits, limitations, and alternatives regarding  colonoscopy have been reviewed with the patient.  Questions have been answered.  All parties agreeable.   Gaylyn Cheers, MD  03/14/2017, 11:20 AM

## 2017-03-14 NOTE — Op Note (Signed)
[facility] Gastroenterology Patient Name: Teresa Black Procedure Date: 03/14/2017 11:20 AM MRN: [id number] Account #: [id number] Date of Birth: Apr 11, 1951 Admit Type: Outpatient Age: 66 Room: [facility] ENDO ROOM 1 Gender: Female Note Status: Finalized Procedure:            Colonoscopy Indications:          High risk colon cancer surveillance: Personal history                        of colonic polyps Providers:            Manya Silvas, MD Referring MD:         Adrian Prows (Referring MD) Medicines:            Propofol per Anesthesia Complications:        No immediate complications. Procedure:            Pre-Anesthesia Assessment:                       - After reviewing the risks and benefits, the patient                        was deemed in satisfactory condition to undergo the                        procedure.                       After obtaining informed consent, the colonoscope was                        passed under direct vision. Throughout the procedure,                        the patient's blood pressure, pulse, and oxygen                        saturations were monitored continuously. The                        Colonoscope was introduced through the anus and                        advanced to the the cecum, identified by appendiceal                        orifice and ileocecal valve. The colonoscopy was                        performed without difficulty. The patient tolerated the                        procedure well. The quality of the bowel preparation                        was good. Findings:      Three sessile polyps were found in the sigmoid colon, transverse colon       and cecum. The polyps were diminutive in size. These polyps were removed       with a jumbo cold forceps. Resection and retrieval were complete.  Multiple small-mouthed diverticula were found in the sigmoid colon,       descending colon and transverse colon.  Internal hemorrhoids were found during endoscopy. The hemorrhoids were       small and Grade I (internal hemorrhoids that do not prolapse).      The exam was otherwise without abnormality. Impression:           - Three diminutive polyps in the sigmoid colon, in the                        transverse colon and in the cecum, removed with a jumbo                        cold forceps. Resected and retrieved.                       - Diverticulosis in the sigmoid colon, in the                        descending colon and in the transverse colon.                       - Internal hemorrhoids.                       - The examination was otherwise normal. Recommendation:       - Await pathology results. Manya Silvas, MD 03/14/2017 11:49:19 AM This report has been signed electronically. Number of Addenda: 0 Note Initiated On: 03/14/2017 11:20 AM Scope Withdrawal Time: 0 hours 10 minutes 44 seconds  Total Procedure Duration: 0 hours 18 minutes 22 seconds       [facility]

## 2017-03-14 NOTE — Anesthesia Postprocedure Evaluation (Signed)
Anesthesia Post Note  Patient: Teresa Black  Procedure(s) Performed: Procedure(s) (LRB): COLONOSCOPY WITH PROPOFOL (N/A)  Patient location during evaluation: Endoscopy Anesthesia Type: General Level of consciousness: awake and alert Pain management: pain level controlled Vital Signs Assessment: post-procedure vital signs reviewed and stable Respiratory status: spontaneous breathing and respiratory function stable Cardiovascular status: stable Anesthetic complications: no     Last Vitals:  Vitals:   03/14/17 1024 03/14/17 1151  BP: (!) 163/88 113/70  Pulse: 86 87  Resp: 18 19  Temp: 36.2 C (!) 35.9 C    Last Pain:  Vitals:   03/14/17 1151  TempSrc: Tympanic                 Bobbette Eakes K

## 2017-03-14 NOTE — Anesthesia Preprocedure Evaluation (Signed)
Anesthesia Evaluation  Patient identified by MRN, date of birth, ID band Patient awake    Reviewed: Allergy & Precautions, NPO status , Patient's Chart, lab work & pertinent test results  History of Anesthesia Complications Negative for: history of anesthetic complications  Airway Mallampati: III       Dental   Pulmonary neg pulmonary ROS,           Cardiovascular negative cardio ROS       Neuro/Psych negative neurological ROS     GI/Hepatic negative GI ROS, Neg liver ROS,   Endo/Other  diabetes, Type 2, Oral Hypoglycemic Agents  Renal/GU negative Renal ROS     Musculoskeletal   Abdominal   Peds  Hematology   Anesthesia Other Findings   Reproductive/Obstetrics                             Anesthesia Physical Anesthesia Plan  ASA: III  Anesthesia Plan: General   Post-op Pain Management:    Induction: Intravenous  Airway Management Planned: Nasal Cannula  Additional Equipment:   Intra-op Plan:   Post-operative Plan:   Informed Consent: I have reviewed the patients History and Physical, chart, labs and discussed the procedure including the risks, benefits and alternatives for the proposed anesthesia with the patient or authorized representative who has indicated his/her understanding and acceptance.     Plan Discussed with:   Anesthesia Plan Comments:         Anesthesia Quick Evaluation

## 2017-03-15 ENCOUNTER — Encounter: Payer: Self-pay | Admitting: Unknown Physician Specialty

## 2017-03-15 LAB — SURGICAL PATHOLOGY

## 2017-11-25 ENCOUNTER — Other Ambulatory Visit: Payer: Self-pay | Admitting: Obstetrics and Gynecology

## 2017-11-25 DIAGNOSIS — Z1239 Encounter for other screening for malignant neoplasm of breast: Secondary | ICD-10-CM

## 2018-01-17 ENCOUNTER — Ambulatory Visit
Admission: RE | Admit: 2018-01-17 | Discharge: 2018-01-17 | Disposition: A | Discharge status: Home / Self Care | Payer: Medicare HMO | Source: Ambulatory Visit | Attending: Obstetrics and Gynecology | Admitting: Obstetrics and Gynecology

## 2018-01-17 DIAGNOSIS — Z1231 Encounter for screening mammogram for malignant neoplasm of breast: Secondary | ICD-10-CM | POA: Insufficient documentation

## 2018-01-17 DIAGNOSIS — Z1239 Encounter for other screening for malignant neoplasm of breast: Secondary | ICD-10-CM

## 2018-10-14 ENCOUNTER — Ambulatory Visit
Admission: EM | Admit: 2018-10-14 | Discharge: 2018-10-14 | Disposition: A | Discharge status: Home / Self Care | Payer: Medicare HMO | Attending: Emergency Medicine | Admitting: Emergency Medicine

## 2018-10-14 ENCOUNTER — Other Ambulatory Visit: Payer: Self-pay

## 2018-10-14 DIAGNOSIS — Z7722 Contact with and (suspected) exposure to environmental tobacco smoke (acute) (chronic): Secondary | ICD-10-CM

## 2018-10-14 DIAGNOSIS — J040 Acute laryngitis: Secondary | ICD-10-CM | POA: Insufficient documentation

## 2018-10-14 DIAGNOSIS — J069 Acute upper respiratory infection, unspecified: Secondary | ICD-10-CM | POA: Diagnosis not present

## 2018-10-14 MED ORDER — BENZONATATE 200 MG PO CAPS: ORAL_CAPSULE | ORAL | 0 refills | Status: AC

## 2018-10-14 MED ORDER — AZITHROMYCIN 250 MG PO TABS: 250.0000 mg | ORAL_TABLET | Freq: Every day | ORAL | 0 refills | Status: AC

## 2018-10-14 NOTE — ED Provider Notes (Signed)
[facility]    CSN: [id number] Arrival date & time: 10/14/18  0917     History   Chief Complaint Chief Complaint  Patient presents with  . Hoarse    HPI Teresa Black is a 68 y.o. female.   HPI  68 year old female presents with a cough that started on Monday 5 days prior to this visit and sore throat with recent onset of hoarseness.  Is also had nasal congestion and she has been using saline nasal irrigation.  Temperature today was 99.4 pulse of 86 O2 sats 96% on room air respirations of 18.        Past Medical History:  Diagnosis Date  . Bladder spasms   . Diabetes mellitus without complication ([place])   . Diverticulosis   . Fibrocystic breast disease   . Hemorrhoids, internal     There are no active problems to display for this patient.   Past Surgical History:  Procedure Laterality Date  . BREAST BIOPSY Right 2007   EXCISIONAL - NEG  . COLONOSCOPY  11/23/2004  . COLONOSCOPY WITH PROPOFOL N/A 03/14/2017   Procedure: COLONOSCOPY WITH PROPOFOL;  Surgeon: Manya Silvas, MD;  Location: [facility] ENDOSCOPY;  Service: Endoscopy;  Laterality: N/A;  . HERNIA REPAIR      OB History   No obstetric history on file.      Home Medications    Prior to Admission medications   Medication Sig Start Date End Date Taking? Authorizing Provider  aspirin 81 MG tablet Take 81 mg by mouth daily.   Yes [provider]  Blood Glucose Monitoring Suppl (FIFTY50 GLUCOSE METER 2.0) W/DEVICE KIT Use 1 each as directed. 11/06/14  Yes [provider]  calcium carbonate (OSCAL) 1500 (600 CA) MG TABS tablet Take by mouth 2 (two) times daily with a meal.   Yes [provider]  loratadine (CLARITIN) 10 MG tablet Take 10 mg by mouth daily as needed.    Yes [provider]  metFORMIN (GLUCOPHAGE) 500 MG tablet Take 1,000 mg by mouth 2 (two) times daily with a meal.    Yes [provider]  Multiple Vitamin (MULTIVITAMIN) tablet Take 1  tablet by mouth daily.   Yes [provider]  azithromycin (ZITHROMAX) 250 MG tablet Take 1 tablet (250 mg total) by mouth daily. Take first 2 tablets together, then 1 every day until finished. 10/14/18   Lorin Picket, PA-C  benzonatate (TESSALON) 200 MG capsule Take one cap TID PRN cough 10/14/18   Lorin Picket, PA-C    Family History Family History  Problem Relation Age of Onset  . Breast cancer Neg Hx     Social History Social History   Tobacco Use  . Smoking status: Passive Smoke Exposure - Never Smoker  . Smokeless tobacco: Never Used  Substance Use Topics  . Alcohol use: No    Alcohol/week: 0.0 standard drinks  . Drug use: No     Allergies   Codeine and Sulfa antibiotics   Review of Systems Review of Systems  Constitutional: Positive for activity change, fatigue and fever. Negative for chills.  HENT: Positive for congestion, ear pain, postnasal drip, sore throat and voice change.   Respiratory: Positive for cough.   All other systems reviewed and are negative.    Physical Exam Triage Vital Signs ED Triage Vitals  Enc Vitals Group     BP 10/14/18 0933 126/75     Pulse Rate 10/14/18 0933 86  Resp 10/14/18 0933 18     Temp 10/14/18 0933 99.4 F (37.4 C)     Temp Source 10/14/18 0933 Oral     SpO2 10/14/18 0933 96 %     Weight 10/14/18 0931 210 lb (95.3 kg)     Height 10/14/18 0931 '5\' 4"'$  (1.626 m)     Head Circumference --      Peak Flow --      Pain Score 10/14/18 0930 4     Pain Loc --      Pain Edu? --      Excl. in [place]? --    No data found.  Updated Vital Signs BP 126/75 (BP Location: Right Arm)   Pulse 86   Temp 99.4 F (37.4 C) (Oral)   Resp 18   Ht '5\' 4"'$  (1.626 m)   Wt 210 lb (95.3 kg)   SpO2 96%   BMI 36.05 kg/m   Visual Acuity Right Eye Distance:   Left Eye Distance:   Bilateral Distance:    Right Eye Near:   Left Eye Near:    Bilateral Near:     Physical Exam Vitals signs and nursing note reviewed.    Constitutional:      General: She is not in acute distress.    Appearance: Normal appearance. She is not ill-appearing, toxic-appearing or diaphoretic.  HENT:     Head: Normocephalic.     Right Ear: Tympanic membrane, ear canal and external ear normal.     Left Ear: Tympanic membrane, ear canal and external ear normal.     Nose: Congestion present.     Mouth/Throat:     Mouth: Mucous membranes are moist.     Pharynx: Oropharynx is clear. No oropharyngeal exudate or posterior oropharyngeal erythema.  Eyes:     General:        Right eye: No discharge.        Left eye: No discharge.     Conjunctiva/sclera: Conjunctivae normal.  Neck:     Musculoskeletal: Normal range of motion and neck supple.  Pulmonary:     Effort: Pulmonary effort is normal.     Breath sounds: Normal breath sounds.  Musculoskeletal: Normal range of motion.  Lymphadenopathy:     Cervical: No cervical adenopathy.  Skin:    General: Skin is warm and dry.  Neurological:     General: No focal deficit present.     Mental Status: She is alert and oriented to person, place, and time.  Psychiatric:        Mood and Affect: Mood normal.        Behavior: Behavior normal.        Thought Content: Thought content normal.        Judgment: Judgment normal.      UC Treatments / Results  Labs (all labs ordered are listed, but only abnormal results are displayed) Labs Reviewed - No data to display  EKG None  Radiology No results found.  Procedures Procedures (including critical care time)  Medications Ordered in UC Medications - No data to display  Initial Impression / Assessment and Plan / UC Course  I have reviewed the triage vital signs and the nursing notes.  Pertinent labs & imaging results that were available during my care of the patient were reviewed by me and considered in my medical decision making (see chart for details).   Has upper respiratory infection with a low-grade fever.  Not able to  utilize Flonase and prefers  to stay with the nasal saline.  Place her on a Z-Pak and provide her with Tessalon Perles for the cough.  Will continue to take Tylenol or Motrin for her fever /achiness.   Final Clinical Impressions(s) / UC Diagnoses   Final diagnoses:  Acute upper respiratory infection  Laryngitis   Discharge Instructions   None    ED Prescriptions    Medication Sig Dispense Auth. Provider   azithromycin (ZITHROMAX) 250 MG tablet Take 1 tablet (250 mg total) by mouth daily. Take first 2 tablets together, then 1 every day until finished. 6 tablet Lorin Picket, PA-C   benzonatate (TESSALON) 200 MG capsule Take one cap TID PRN cough 30 capsule Lorin Picket, PA-C     Controlled Substance Prescriptions [place] Controlled Substance Registry consulted? Not Applicable   Lorin Picket, PA-C 10/14/18 1043

## 2018-10-14 NOTE — ED Triage Notes (Signed)
Patient complains of hoarseness, cough that started on Monday. Patient states that she thought this may be allergies and has been taking medication without relief. Feels like symptoms are progressing.

## 2018-11-28 ENCOUNTER — Other Ambulatory Visit: Payer: Self-pay | Admitting: Obstetrics and Gynecology

## 2018-11-28 DIAGNOSIS — Z1231 Encounter for screening mammogram for malignant neoplasm of breast: Secondary | ICD-10-CM

## 2019-03-26 ENCOUNTER — Other Ambulatory Visit: Payer: Self-pay

## 2019-03-26 ENCOUNTER — Ambulatory Visit
Admission: RE | Admit: 2019-03-26 | Discharge: 2019-03-26 | Disposition: A | Discharge status: Home / Self Care | Payer: Medicare HMO | Source: Ambulatory Visit | Attending: Obstetrics and Gynecology | Admitting: Obstetrics and Gynecology

## 2019-03-26 DIAGNOSIS — Z1231 Encounter for screening mammogram for malignant neoplasm of breast: Secondary | ICD-10-CM | POA: Insufficient documentation

## 2019-03-28 ENCOUNTER — Other Ambulatory Visit: Payer: Self-pay | Admitting: Obstetrics and Gynecology

## 2019-03-28 DIAGNOSIS — N6310 Unspecified lump in the right breast, unspecified quadrant: Secondary | ICD-10-CM

## 2019-03-28 DIAGNOSIS — R928 Other abnormal and inconclusive findings on diagnostic imaging of breast: Secondary | ICD-10-CM

## 2019-04-05 ENCOUNTER — Ambulatory Visit
Admission: RE | Admit: 2019-04-05 | Discharge: 2019-04-05 | Disposition: A | Discharge status: Home / Self Care | Payer: Medicare HMO | Source: Ambulatory Visit | Attending: Obstetrics and Gynecology | Admitting: Obstetrics and Gynecology

## 2019-04-05 ENCOUNTER — Other Ambulatory Visit: Payer: Self-pay

## 2019-04-05 DIAGNOSIS — R928 Other abnormal and inconclusive findings on diagnostic imaging of breast: Secondary | ICD-10-CM | POA: Diagnosis present

## 2019-04-05 DIAGNOSIS — N6310 Unspecified lump in the right breast, unspecified quadrant: Secondary | ICD-10-CM | POA: Diagnosis present

## 2020-01-15 ENCOUNTER — Other Ambulatory Visit: Payer: Self-pay | Admitting: Obstetrics and Gynecology

## 2020-01-15 DIAGNOSIS — Z1231 Encounter for screening mammogram for malignant neoplasm of breast: Secondary | ICD-10-CM

## 2020-03-26 ENCOUNTER — Ambulatory Visit
Admission: RE | Admit: 2020-03-26 | Discharge: 2020-03-26 | Disposition: A | Discharge status: Home / Self Care | Payer: Medicare HMO | Source: Ambulatory Visit | Attending: Obstetrics and Gynecology | Admitting: Obstetrics and Gynecology

## 2020-03-26 DIAGNOSIS — Z1231 Encounter for screening mammogram for malignant neoplasm of breast: Secondary | ICD-10-CM | POA: Diagnosis not present

## 2020-04-10 ENCOUNTER — Other Ambulatory Visit: Payer: Self-pay | Admitting: Obstetrics and Gynecology

## 2020-04-10 DIAGNOSIS — N6320 Unspecified lump in the left breast, unspecified quadrant: Secondary | ICD-10-CM

## 2020-04-10 DIAGNOSIS — R928 Other abnormal and inconclusive findings on diagnostic imaging of breast: Secondary | ICD-10-CM

## 2020-04-11 ENCOUNTER — Ambulatory Visit
Admission: RE | Admit: 2020-04-11 | Discharge: 2020-04-11 | Disposition: A | Discharge status: Home / Self Care | Payer: Medicare HMO | Source: Ambulatory Visit | Attending: Obstetrics and Gynecology | Admitting: Obstetrics and Gynecology

## 2020-04-11 DIAGNOSIS — R928 Other abnormal and inconclusive findings on diagnostic imaging of breast: Secondary | ICD-10-CM

## 2020-04-11 DIAGNOSIS — N6320 Unspecified lump in the left breast, unspecified quadrant: Secondary | ICD-10-CM

## 2020-04-19 IMAGING — MG DIGITAL DIAGNOSTIC UNILATERAL RIGHT MAMMOGRAM WITH TOMO AND CAD
6 series · 6 of 18 positions shown · non-contrast
Comparison: 03/26/2019 and earlier

CLINICAL DATA: The patient returns after screening study for
evaluation of possible RIGHT breast masses.

EXAM:
DIGITAL DIAGNOSTIC RIGHT MAMMOGRAM WITH CAD AND TOMO
ULTRASOUND RIGHT BREAST

[R CC synth-2D (1 of 2)]
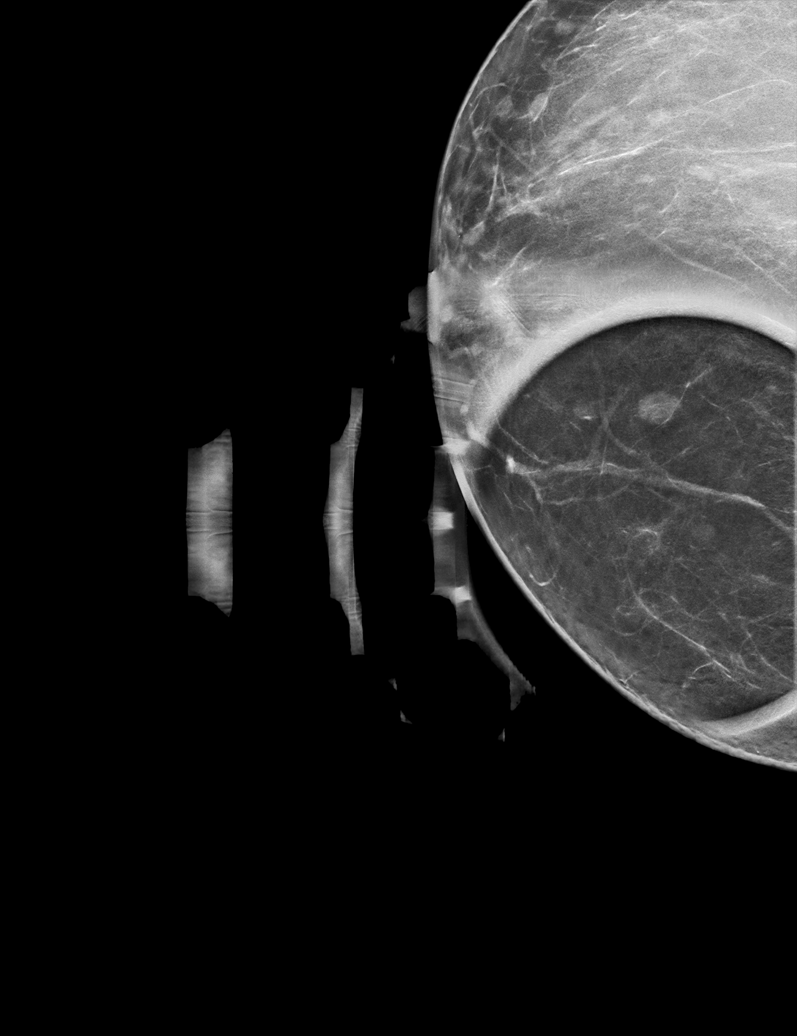

[R MLO synth-2D]
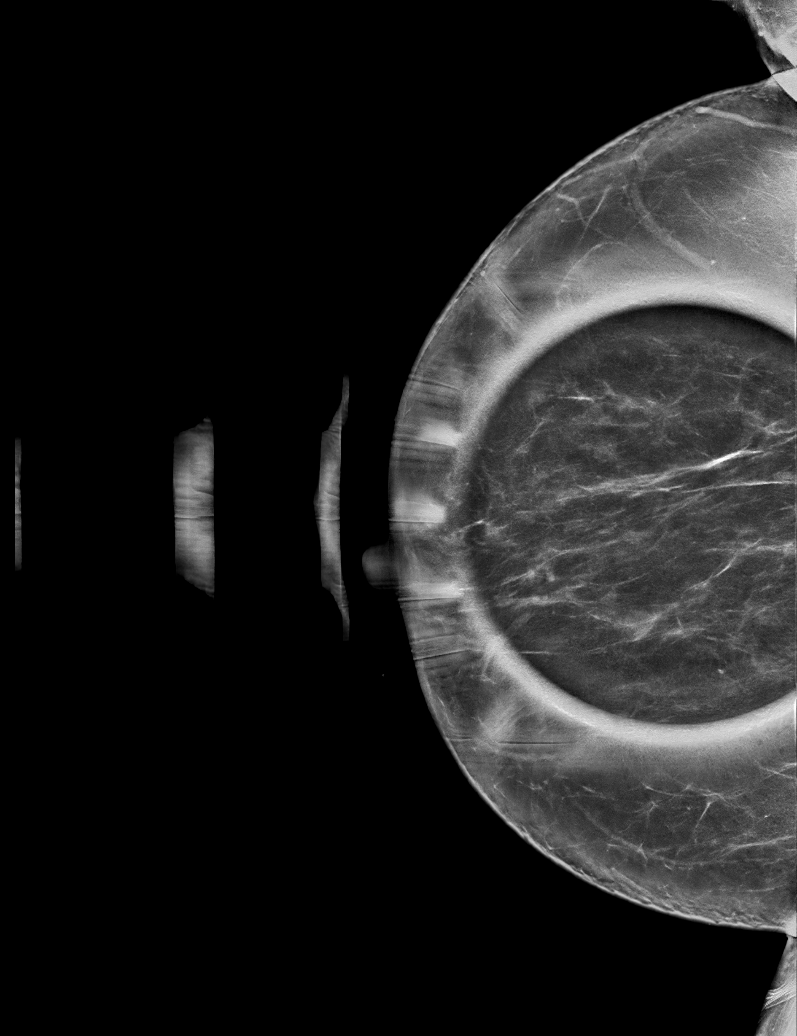

[R CC synth-2D (2 of 2)]
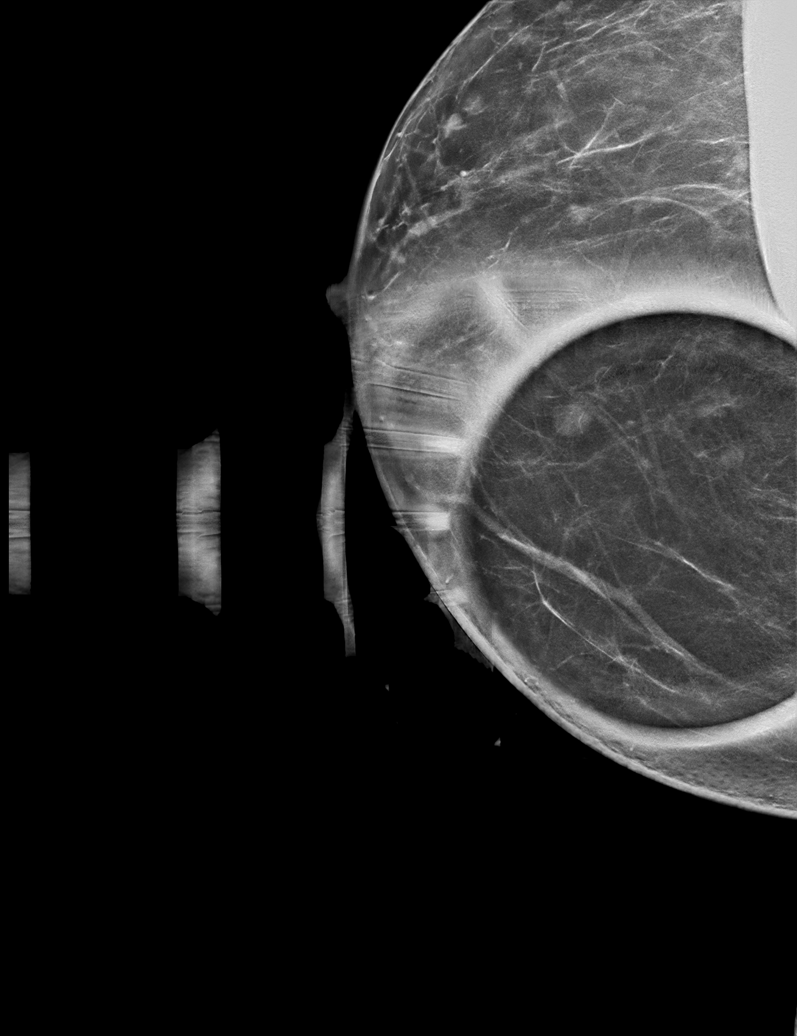

[R CC tomo (1 of 2) · tomo slice 37/74.0]
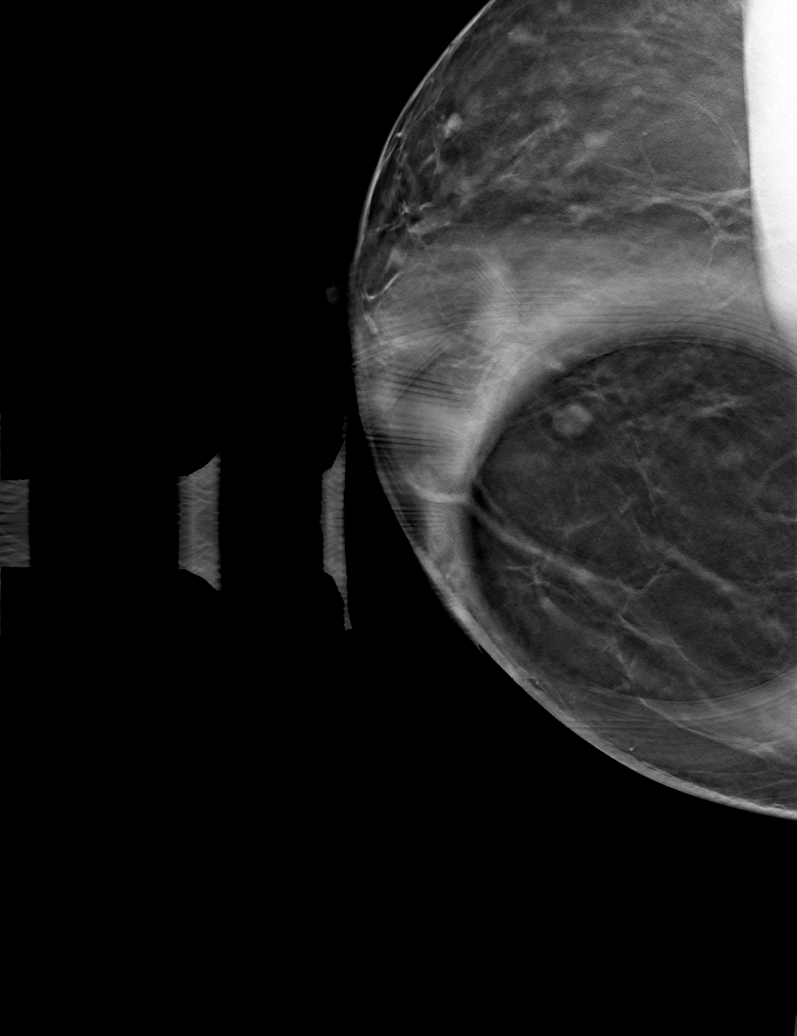

[R MLO tomo · tomo slice 39/78.0]
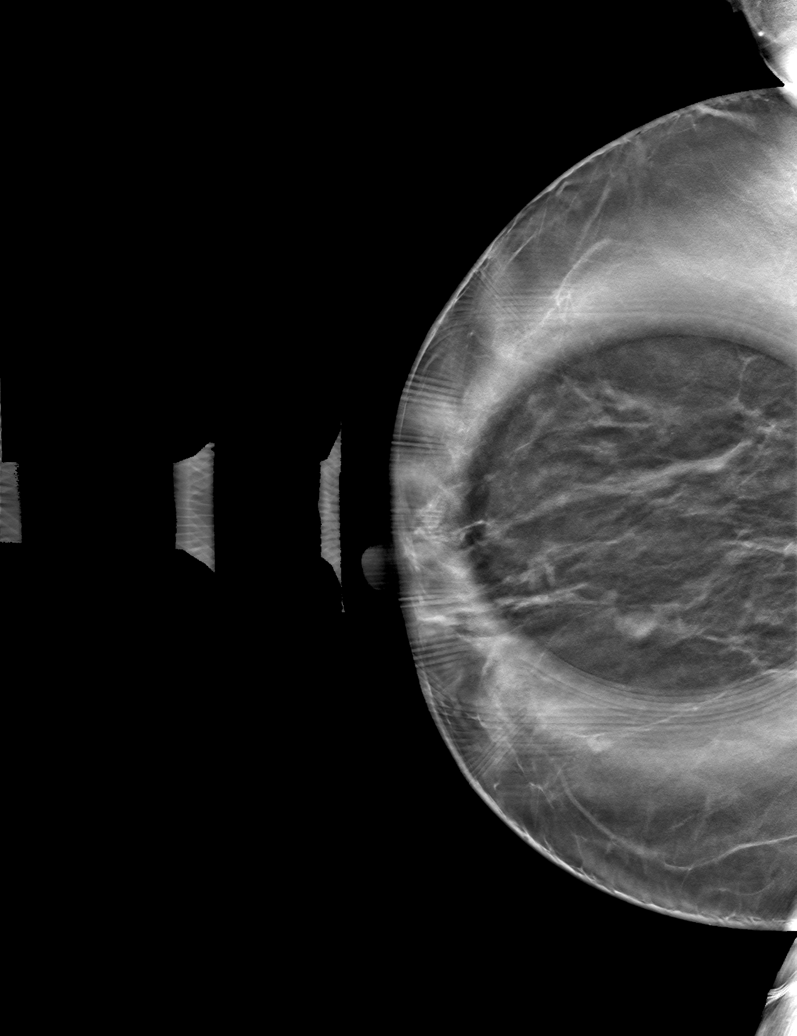

[R CC tomo (2 of 2) · tomo slice 31/62.0]
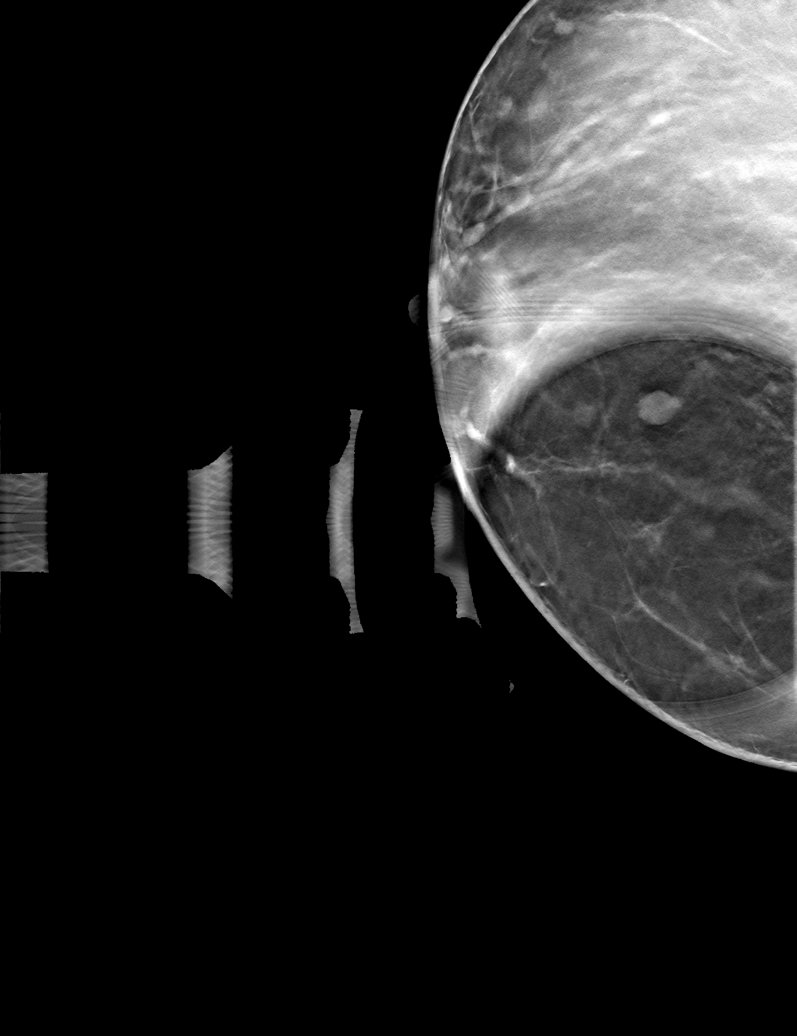

[6 of 18 positions shown; findings below may reference images not displayed]

ACR Breast Density Category b: There are scattered areas of
fibroglandular density.
FINDINGS: Additional 2-D and 3-D images are performed. These views demonstrate
a circumscribed oval 8 millimeter mass in the MEDIAL aspect of the
RIGHT breast and further evaluated sonographically. A second smaller
oval circumscribed mass is identified further medially in the RIGHT
breast.

Mammographic images were processed with CAD.

On physical exam, I palpate no abnormality in the MEDIAL aspect of
the RIGHT breast.

Targeted ultrasound is performed, showing a simple cyst in the 3
o'clock location of the RIGHT breast 2 centimeters from the nipple
which measures 0.8 x 0.6 x 0.6 centimeters. A small cyst is also
identified in the 2 o'clock location 3 centimeters from the nipple
measuring 0.4 x 0.4 x 0.3 centimeters. No solid mass or areas of
acoustic shadowing are identified.
IMPRESSION: Benign RIGHT breast cysts. No mammographic or ultrasound evidence
for malignancy.

RECOMMENDATION:
Screening mammogram in one year.(Code:DW-0-F6C)

I have discussed the findings and recommendations with the patient.
Results were also provided in writing at the conclusion of the
visit. If applicable, a reminder letter will be sent to the patient
regarding the next appointment.

BI-RADS CATEGORY  2: Benign.

## 2020-04-19 IMAGING — US ULTRASOUND RIGHT BREAST LIMITED
1 series · 10 of 10 positions shown · non-contrast
Comparison: 03/26/2019 and earlier

CLINICAL DATA: The patient returns after screening study for
evaluation of possible RIGHT breast masses.

EXAM:
DIGITAL DIAGNOSTIC RIGHT MAMMOGRAM WITH CAD AND TOMO
ULTRASOUND RIGHT BREAST

[Series 1: ultrasound right breast limited · 0.06mm/px · 10 of 10 slices shown]
[im 1/10]
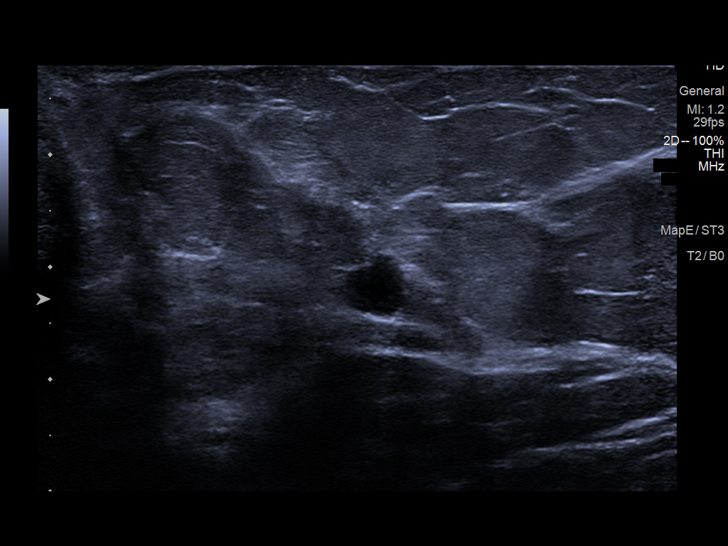
[im 2/10]
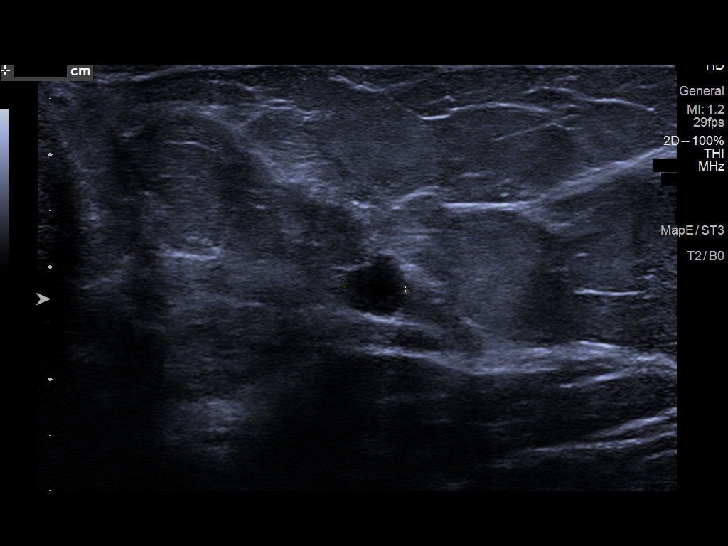
[im 3/10]
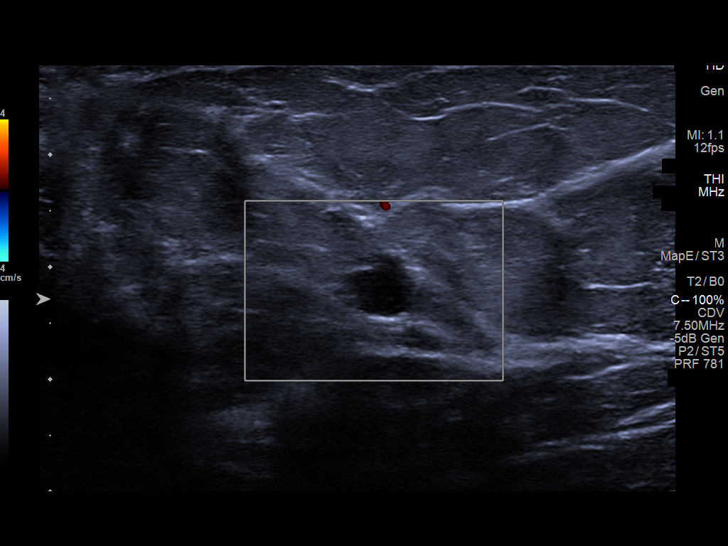
[im 4/10]
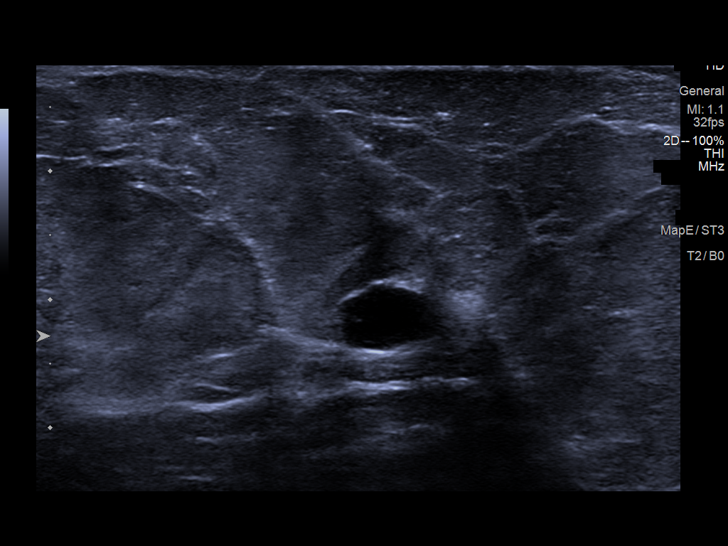
[im 5/10]
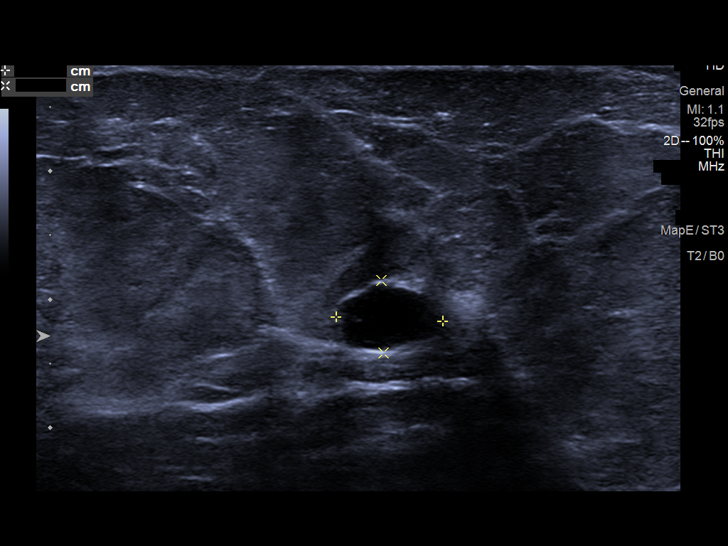
[im 6/10]
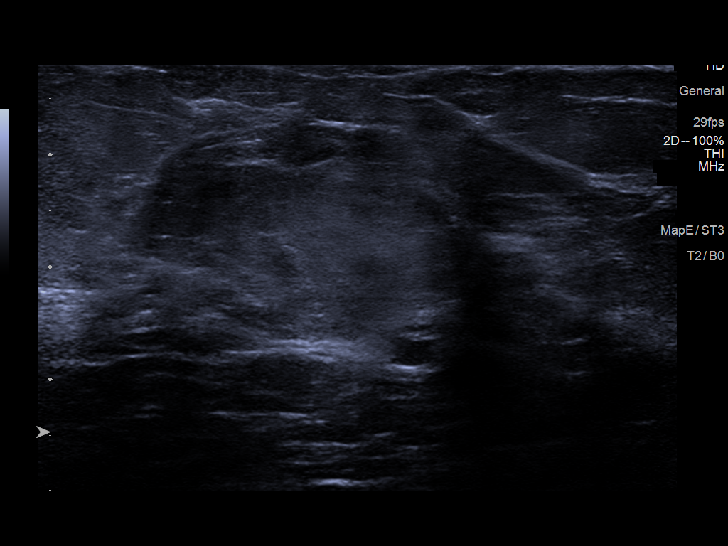
[im 7/10]
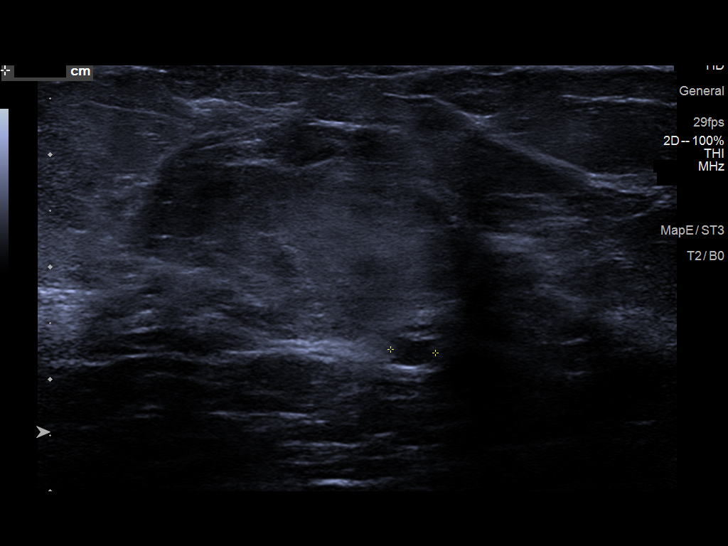
[im 8/10]
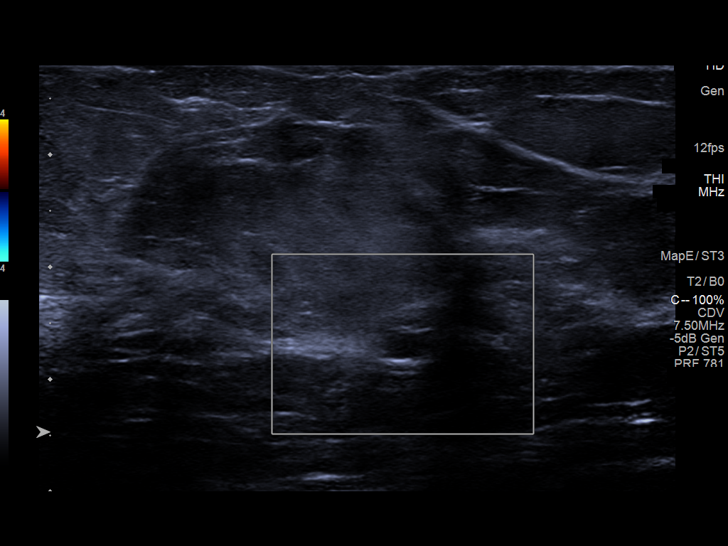
[im 9/10]
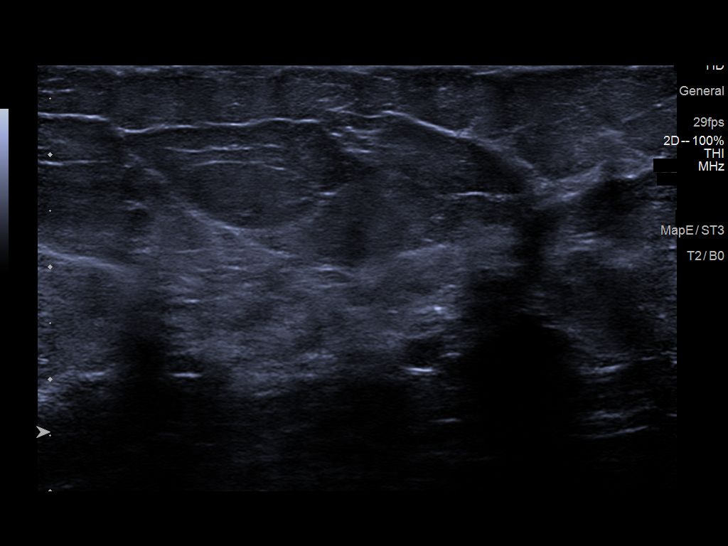
[im 10/10]
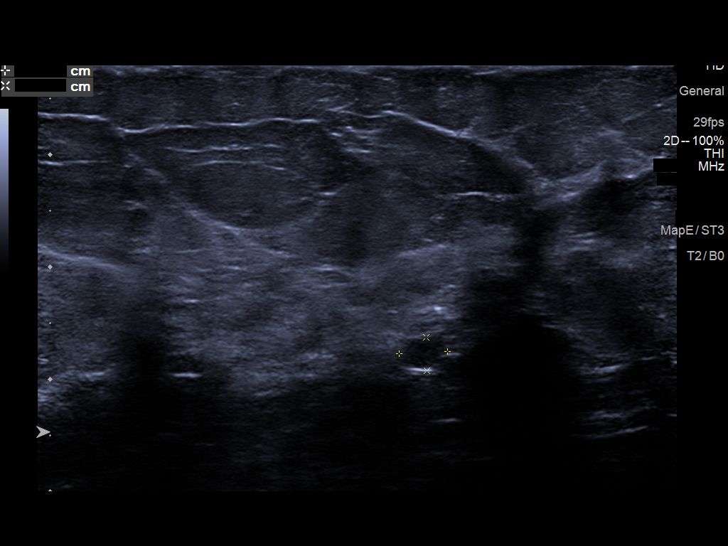

[10 of 10 positions shown; findings below may reference images not displayed]

ACR Breast Density Category b: There are scattered areas of
fibroglandular density.
FINDINGS: Additional 2-D and 3-D images are performed. These views demonstrate
a circumscribed oval 8 millimeter mass in the MEDIAL aspect of the
RIGHT breast and further evaluated sonographically. A second smaller
oval circumscribed mass is identified further medially in the RIGHT
breast.

Mammographic images were processed with CAD.

On physical exam, I palpate no abnormality in the MEDIAL aspect of
the RIGHT breast.

Targeted ultrasound is performed, showing a simple cyst in the 3
o'clock location of the RIGHT breast 2 centimeters from the nipple
which measures 0.8 x 0.6 x 0.6 centimeters. A small cyst is also
identified in the 2 o'clock location 3 centimeters from the nipple
measuring 0.4 x 0.4 x 0.3 centimeters. No solid mass or areas of
acoustic shadowing are identified.
IMPRESSION: Benign RIGHT breast cysts. No mammographic or ultrasound evidence
for malignancy.

RECOMMENDATION:
Screening mammogram in one year.(Code:DW-0-F6C)

I have discussed the findings and recommendations with the patient.
Results were also provided in writing at the conclusion of the
visit. If applicable, a reminder letter will be sent to the patient
regarding the next appointment.

BI-RADS CATEGORY  2: Benign.

## 2020-07-21 ENCOUNTER — Ambulatory Visit: Payer: Medicare HMO | Attending: Internal Medicine

## 2020-07-21 DIAGNOSIS — Z23 Encounter for immunization: Secondary | ICD-10-CM

## 2020-07-21 NOTE — Progress Notes (Signed)
   Covid-19 Vaccination Clinic  Name:  Teresa Black    MRN: [id number] DOB: 1950-12-09  07/21/2020  Teresa Black was observed post Covid-19 immunization for 15 minutes without incident. She was provided with Vaccine Information Sheet and instruction to access the V-Safe system.   Teresa Black was instructed to call 911 with any severe reactions post vaccine: Marland Kitchen Difficulty breathing  . Swelling of face and throat  . A fast heartbeat  . A bad rash all over body  . Dizziness and weakness

## 2020-09-23 ENCOUNTER — Other Ambulatory Visit: Payer: Self-pay | Admitting: Obstetrics and Gynecology

## 2020-09-23 DIAGNOSIS — N6320 Unspecified lump in the left breast, unspecified quadrant: Secondary | ICD-10-CM

## 2020-10-20 ENCOUNTER — Ambulatory Visit
Admission: RE | Admit: 2020-10-20 | Discharge: 2020-10-20 | Disposition: A | Discharge status: Home / Self Care | Payer: Medicare HMO | Source: Ambulatory Visit | Attending: Obstetrics and Gynecology | Admitting: Obstetrics and Gynecology

## 2020-10-20 ENCOUNTER — Other Ambulatory Visit: Payer: Self-pay

## 2020-10-20 DIAGNOSIS — N6320 Unspecified lump in the left breast, unspecified quadrant: Secondary | ICD-10-CM

## 2020-10-20 DIAGNOSIS — N6322 Unspecified lump in the left breast, upper inner quadrant: Secondary | ICD-10-CM | POA: Insufficient documentation

## 2020-10-22 ENCOUNTER — Other Ambulatory Visit: Payer: Self-pay | Admitting: Obstetrics and Gynecology

## 2020-10-22 DIAGNOSIS — N6320 Unspecified lump in the left breast, unspecified quadrant: Secondary | ICD-10-CM

## 2021-02-10 ENCOUNTER — Ambulatory Visit: Payer: Medicare HMO | Attending: Internal Medicine

## 2021-02-10 DIAGNOSIS — Z23 Encounter for immunization: Secondary | ICD-10-CM

## 2021-02-10 NOTE — Progress Notes (Signed)
   Covid-19 Vaccination Clinic  Name:  Teresa Black    MRN: [id number] DOB: 1951/05/20  02/10/2021  [name] was observed post Covid-19 immunization for 15 minutes without incident. She was provided with Vaccine Information Sheet and instruction to access the V-Safe system.   [name] was instructed to call 911 with any severe reactions post vaccine: Marland Kitchen Difficulty breathing  . Swelling of face and throat  . A fast heartbeat  . A bad rash all over body  . Dizziness and weakness   Immunizations Administered    Name Date Dose VIS Date Route   PFIZER Comrnaty(Gray TOP) Covid-19 Vaccine 02/10/2021 10:13 AM 0.3 mL 09/18/2020 Intramuscular   Manufacturer: [employer], [employer]   Lot: [id number]   NDC: [place], PharmD, MBA Clinical Pharmacist

## 2021-02-11 ENCOUNTER — Other Ambulatory Visit: Payer: Self-pay

## 2021-02-11 MED ORDER — PFIZER-BIONT COVID-19 VAC-TRIS 30 MCG/0.3ML IM SUSP
INTRAMUSCULAR | 0 refills | Status: AC
  Filled 2021-02-11: qty 0.3, 1d supply, fill #0

## 2021-04-20 ENCOUNTER — Ambulatory Visit
Admission: RE | Admit: 2021-04-20 | Discharge: 2021-04-20 | Disposition: A | Discharge status: Home / Self Care | Payer: Medicare HMO | Source: Ambulatory Visit | Attending: Obstetrics and Gynecology | Admitting: Obstetrics and Gynecology

## 2021-04-20 ENCOUNTER — Other Ambulatory Visit: Payer: Self-pay

## 2021-04-20 DIAGNOSIS — N6320 Unspecified lump in the left breast, unspecified quadrant: Secondary | ICD-10-CM

## 2021-04-20 DIAGNOSIS — N6324 Unspecified lump in the left breast, lower inner quadrant: Secondary | ICD-10-CM | POA: Insufficient documentation

## 2021-07-07 ENCOUNTER — Other Ambulatory Visit: Payer: Self-pay

## 2021-07-07 ENCOUNTER — Ambulatory Visit: Payer: Medicare HMO | Attending: Internal Medicine

## 2021-07-07 DIAGNOSIS — Z23 Encounter for immunization: Secondary | ICD-10-CM

## 2021-07-07 MED ORDER — PFIZER COVID-19 VAC BIVALENT 30 MCG/0.3ML IM SUSP
INTRAMUSCULAR | 0 refills | Status: AC
  Filled 2021-07-07: qty 0.3, 1d supply, fill #0

## 2021-07-07 NOTE — Progress Notes (Signed)
   Covid-19 Vaccination Clinic  Name:  Teresa Black    MRN: [id number] DOB: Oct 13, 1950  07/07/2021  Ms. Allender was observed post Covid-19 immunization for 15 minutes without incident. She was provided with Vaccine Information Sheet and instruction to access the V-Safe system.   Ms. Barrell was instructed to call 911 with any severe reactions post vaccine: Difficulty breathing  Swelling of face and throat  A fast heartbeat  A bad rash all over body  Dizziness and weakness   Lu Duffel, PharmD, MBA Clinical Acute Care Pharmacist

## 2022-01-19 ENCOUNTER — Other Ambulatory Visit: Payer: Self-pay | Admitting: Obstetrics and Gynecology

## 2022-01-19 DIAGNOSIS — N6322 Unspecified lump in the left breast, upper inner quadrant: Secondary | ICD-10-CM

## 2022-01-19 DIAGNOSIS — Z1231 Encounter for screening mammogram for malignant neoplasm of breast: Secondary | ICD-10-CM

## 2022-01-19 DIAGNOSIS — R928 Other abnormal and inconclusive findings on diagnostic imaging of breast: Secondary | ICD-10-CM

## 2022-04-20 ENCOUNTER — Ambulatory Visit
Admission: RE | Admit: 2022-04-20 | Discharge: 2022-04-20 | Disposition: A | Discharge status: Home / Self Care | Payer: Medicare HMO | Source: Ambulatory Visit | Attending: Obstetrics and Gynecology | Admitting: Obstetrics and Gynecology

## 2022-04-20 DIAGNOSIS — N6322 Unspecified lump in the left breast, upper inner quadrant: Secondary | ICD-10-CM | POA: Diagnosis present

## 2022-04-20 DIAGNOSIS — Z1231 Encounter for screening mammogram for malignant neoplasm of breast: Secondary | ICD-10-CM | POA: Diagnosis present

## 2023-01-26 ENCOUNTER — Other Ambulatory Visit: Payer: Self-pay | Admitting: Obstetrics and Gynecology

## 2023-01-26 DIAGNOSIS — Z1231 Encounter for screening mammogram for malignant neoplasm of breast: Secondary | ICD-10-CM

## 2023-04-22 ENCOUNTER — Ambulatory Visit
Admission: RE | Admit: 2023-04-22 | Discharge: 2023-04-22 | Disposition: A | Discharge status: Home / Self Care | Payer: Medicare HMO | Source: Ambulatory Visit | Attending: Obstetrics and Gynecology | Admitting: Obstetrics and Gynecology

## 2023-04-22 DIAGNOSIS — Z1231 Encounter for screening mammogram for malignant neoplasm of breast: Secondary | ICD-10-CM | POA: Insufficient documentation

## 2023-04-26 ENCOUNTER — Other Ambulatory Visit: Payer: Self-pay | Admitting: Obstetrics and Gynecology

## 2023-04-26 DIAGNOSIS — R928 Other abnormal and inconclusive findings on diagnostic imaging of breast: Secondary | ICD-10-CM

## 2023-04-26 DIAGNOSIS — N63 Unspecified lump in unspecified breast: Secondary | ICD-10-CM

## 2023-05-02 ENCOUNTER — Ambulatory Visit
Admission: RE | Admit: 2023-05-02 | Discharge: 2023-05-02 | Disposition: A | Discharge status: Home / Self Care | Payer: Medicare HMO | Source: Ambulatory Visit | Attending: Obstetrics and Gynecology | Admitting: Obstetrics and Gynecology

## 2023-05-02 DIAGNOSIS — R928 Other abnormal and inconclusive findings on diagnostic imaging of breast: Secondary | ICD-10-CM | POA: Diagnosis not present

## 2023-05-02 DIAGNOSIS — N63 Unspecified lump in unspecified breast: Secondary | ICD-10-CM

## 2023-09-14 ENCOUNTER — Encounter: Payer: Self-pay | Admitting: Obstetrics and Gynecology

## 2023-09-15 ENCOUNTER — Other Ambulatory Visit: Payer: Self-pay | Admitting: Obstetrics and Gynecology

## 2023-09-15 DIAGNOSIS — N63 Unspecified lump in unspecified breast: Secondary | ICD-10-CM

## 2023-11-08 ENCOUNTER — Ambulatory Visit
Admission: RE | Admit: 2023-11-08 | Discharge: 2023-11-08 | Disposition: A | Discharge status: Home / Self Care | Payer: Medicare HMO | Source: Ambulatory Visit | Attending: Obstetrics and Gynecology | Admitting: Obstetrics and Gynecology

## 2023-11-08 DIAGNOSIS — N6325 Unspecified lump in the left breast, overlapping quadrants: Secondary | ICD-10-CM | POA: Diagnosis present

## 2023-11-08 DIAGNOSIS — N63 Unspecified lump in unspecified breast: Secondary | ICD-10-CM

## 2024-01-30 ENCOUNTER — Other Ambulatory Visit: Payer: Self-pay | Admitting: Obstetrics and Gynecology

## 2024-01-30 DIAGNOSIS — N63 Unspecified lump in unspecified breast: Secondary | ICD-10-CM

## 2024-01-30 DIAGNOSIS — N6321 Unspecified lump in the left breast, upper outer quadrant: Secondary | ICD-10-CM

## 2024-04-24 ENCOUNTER — Encounter

## 2024-04-24 ENCOUNTER — Other Ambulatory Visit

## 2024-05-08 ENCOUNTER — Ambulatory Visit
Admission: RE | Admit: 2024-05-08 | Discharge: 2024-05-08 | Disposition: A | Discharge status: Home / Self Care | Source: Ambulatory Visit | Attending: Obstetrics and Gynecology | Admitting: Obstetrics and Gynecology

## 2024-05-08 DIAGNOSIS — N63 Unspecified lump in unspecified breast: Secondary | ICD-10-CM | POA: Insufficient documentation

## 2024-05-08 DIAGNOSIS — N6321 Unspecified lump in the left breast, upper outer quadrant: Secondary | ICD-10-CM
